# Patient Record
Sex: Male | Born: 1995
Health system: Southern US, Community
[De-identification: ages and names within clinical notes are randomized; demographics above are authoritative.]

## PROBLEM LIST (undated history)

## (undated) DIAGNOSIS — J45909 Unspecified asthma, uncomplicated: Secondary | ICD-10-CM

## (undated) DIAGNOSIS — T7840XA Allergy, unspecified, initial encounter: Secondary | ICD-10-CM

## (undated) HISTORY — PX: WISDOM TOOTH EXTRACTION: SHX21

## (undated) HISTORY — DX: Allergy, unspecified, initial encounter: T78.40XA

## (undated) HISTORY — DX: Unspecified asthma, uncomplicated: J45.909

---
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
## 2016-02-03 DIAGNOSIS — R0982 Postnasal drip: Secondary | ICD-10-CM | POA: Diagnosis not present

## 2016-02-03 DIAGNOSIS — J3489 Other specified disorders of nose and nasal sinuses: Secondary | ICD-10-CM | POA: Diagnosis not present

## 2016-02-03 DIAGNOSIS — R062 Wheezing: Secondary | ICD-10-CM | POA: Diagnosis not present

## 2016-02-03 DIAGNOSIS — R05 Cough: Secondary | ICD-10-CM | POA: Diagnosis not present

## 2016-02-03 DIAGNOSIS — J069 Acute upper respiratory infection, unspecified: Secondary | ICD-10-CM | POA: Diagnosis not present

## 2016-06-13 DIAGNOSIS — J069 Acute upper respiratory infection, unspecified: Secondary | ICD-10-CM | POA: Diagnosis not present

## 2017-03-23 ENCOUNTER — Encounter: Payer: Self-pay | Admitting: Medical

## 2017-03-23 ENCOUNTER — Ambulatory Visit: Payer: 59 | Admitting: Medical

## 2017-03-23 VITALS — BP 122/76 | HR 87 | Temp 98.3°F | Ht 69.0 in | Wt 222.0 lb

## 2017-03-23 DIAGNOSIS — J069 Acute upper respiratory infection, unspecified: Secondary | ICD-10-CM | POA: Diagnosis not present

## 2017-03-23 NOTE — Progress Notes (Signed)
Subjective:  Daniel Wood is a 22 y.o. male who presents for respiratory illness.  Here as a new patient today.  He reports a 5-day history of illness starting with cough, runny nose, froggy voice, bad headache, slight ear pressure, was worse 2 days ago, but now feels like it is improving.  He use Sudafed a few times but not taking anything regularly.  Denies fever, no nausea vomiting or diarrhea, has cough but not productive, no sore throat no body aches or chills.  No sick contacts.  Non-smoker. No other aggravating or relieving factors.  No other c/o.  No past medical history on file.  ROS as in subjective   Objective: BP 122/76   Pulse 87   Temp 98.3 F (36.8 C)   Ht 5\' 9"  (1.753 m)   Wt 222 lb (100.7 kg)   SpO2 96%   BMI 32.78 kg/m   General appearance: Alert, WD/WN, no distress, mildly ill appearing                             Skin: warm, no rash                           Head: no sinus tenderness                            Eyes: conjunctiva normal, corneas clear, PERRLA                            Ears: pearly TMs, external ear canals normal                          Nose: septum midline, turbinates swollen, with erythema and clear discharge             Mouth/throat: MMM, tongue normal, mild pharyngeal erythema                           Neck: supple, no adenopathy, no thyromegaly, non tender                          Heart: RRR, normal S1, S2, no murmurs                         Lungs: CTA bilaterally, no wheezes, rales, or rhonchi      Assessment  Encounter Diagnosis  Name Primary?  Marland Kitchen. Upper respiratory tract infection, unspecified type Yes      Plan: Discussed symptoms and exam suggesting mild viral URI.  Specific home care recommendations today include:  Only take over-the-counter (OTC) or prescription medicines for pain, discomfort, or fever as directed by your caregiver.    Decongestant: You may use OTC Guaifenesin (Mucinex plain) for congestion.  You may use  Pseudoephedrine (Sudafed) only if you don't have blood pressure problems or a diagnosis of hypertension.  Cough suppression: If you have cough from drainage, you may use over-the-counter Dextromethorphan (Delsym) as directed on the label  Sore throat remedies:  You may use salt water gargles, warm fluids such as coffee or hot tea, or honey/tea/lemon mixture to sooth sore throat pain.  You may use OTC sore throat remedies such as Cepacol lozenges or Chloraseptic  spray for sore throat pain.  Runny nose and sneezing remedies: You may use OTC antihistamine such as Zyrtec or Benadryl, but caution as these can cause drowsiness.    Pain/fever relief: You may use over-the-counter Tylenol for pain or fever  Drink extra fluids. Fluids help thin the mucus so your sinuses can drain more easily.   Applying either moist heat or ice packs to the sinus areas may help relieve discomfort.  Use saline nasal sprays to help moisten your sinuses. The sprays can be found at your local drugstore.   Patient was advised to call or return if worse or not improving in the next few days.    Patient voiced understanding of diagnosis, recommendations, and treatment plan.  After visit summary given.   He has a physical appt coming up.

## 2017-03-23 NOTE — Patient Instructions (Addendum)
Thank you for giving me the opportunity to serve you today.   Your diagnosis today includes: Encounter Diagnosis  Name Primary?  Marland Kitchen Upper respiratory tract infection, unspecified type Yes     Specific home care recommendations today include:  Only take over-the-counter (OTC) or prescription medicines for pain, discomfort, or fever as directed by your caregiver.    Decongestant: You may use OTC Guaifenesin (Mucinex plain) for congestion.  You may use Pseudoephedrine (Sudafed) only if you don't have blood pressure problems or a diagnosis of hypertension.  Cough suppression: If you have cough from drainage, you may use over-the-counter Dextromethorphan (Delsym) as directed on the label  Sore throat remedies:  You may use salt water gargles, warm fluids such as coffee or hot tea, or honey/tea/lemon mixture to sooth sore throat pain.  You may use OTC sore throat remedies such as Cepacol lozenges or Chloraseptic spray for sore throat pain.  Runny nose and sneezing remedies: You may use OTC antihistamine such as Zyrtec or Benadryl, but caution as these can cause drowsiness.    Pain/fever relief: You may use over-the-counter Tylenol for pain or fever  Drink extra fluids. Fluids help thin the mucus so your sinuses can drain more easily.   Applying either moist heat or ice packs to the sinus areas may help relieve discomfort.  Use saline nasal sprays to help moisten your sinuses. The sprays can be found at your local drugstore.    Please call or return if worse or not improving in the next few days.      I have included other useful information below for your review.   Upper Respiratory Infection, Adult An upper respiratory infection (URI) is also known as the common cold. It is often caused by a type of germ (virus). Colds are easily spread (contagious). You can pass it to others by kissing, coughing, sneezing, or drinking out of the same glass. Usually, you get better in 1 or 2 weeks.    HOME CARE   Only take medicine as told by your doctor.   Use a warm mist humidifier or breathe in steam from a hot shower.   Drink enough water and fluids to keep your pee (urine) clear or pale yellow.   Get plenty of rest.   Return to work when your temperature is back to normal or as told by your doctor. You may use a face mask and wash your hands to stop your cold from spreading.  GET HELP RIGHT AWAY IF:   After the first few days, you feel you are getting worse.   You have questions about your medicine.   You have chills, shortness of breath, or brown or red spit (mucus).   You have yellow or brown snot (nasal discharge) or pain in the face, especially when you bend forward.   You have a fever, puffy (swollen) neck, pain when you swallow, or white spots in the back of your throat.   You have a bad headache, ear pain, sinus pain, or chest pain.   You have a high-pitched whistling sound when you breathe in and out (wheezing).   You have a lasting cough or cough up blood.   You have sore muscles or a stiff neck.  MAKE SURE YOU:   Understand these instructions.   Will watch your condition.   Will get help right away if you are not doing well or get worse.  Document Released: 07/13/2007 Document Revised: 10/06/2010 Document Reviewed: 05/31/2010 [employer] Patient Information  [address] [employer], [place].

## 2017-04-04 ENCOUNTER — Encounter: Payer: Self-pay | Admitting: Medical

## 2017-04-04 ENCOUNTER — Ambulatory Visit (INDEPENDENT_AMBULATORY_CARE_PROVIDER_SITE_OTHER): Payer: 59 | Admitting: Medical

## 2017-04-04 VITALS — BP 120/78 | HR 77 | Temp 98.4°F | Ht 69.0 in | Wt 222.0 lb

## 2017-04-04 DIAGNOSIS — Z113 Encounter for screening for infections with a predominantly sexual mode of transmission: Secondary | ICD-10-CM

## 2017-04-04 DIAGNOSIS — R0600 Dyspnea, unspecified: Secondary | ICD-10-CM | POA: Insufficient documentation

## 2017-04-04 DIAGNOSIS — Z0000 Encounter for general adult medical examination without abnormal findings: Secondary | ICD-10-CM | POA: Insufficient documentation

## 2017-04-04 DIAGNOSIS — Z7185 Encounter for immunization safety counseling: Secondary | ICD-10-CM | POA: Insufficient documentation

## 2017-04-04 DIAGNOSIS — R062 Wheezing: Secondary | ICD-10-CM

## 2017-04-04 DIAGNOSIS — Z7189 Other specified counseling: Secondary | ICD-10-CM | POA: Diagnosis not present

## 2017-04-04 LAB — POCT URINALYSIS DIP (PROADVANTAGE DEVICE)
Bilirubin, UA: NEGATIVE
Glucose, UA: NEGATIVE mg/dL
Ketones, POC UA: NEGATIVE mg/dL
Leukocytes, UA: NEGATIVE
NITRITE UA: NEGATIVE
PH UA: 6 (ref 5.0–8.0)
Protein Ur, POC: NEGATIVE mg/dL
RBC UA: NEGATIVE
SPECIFIC GRAVITY, URINE: 1.025
UUROB: 3.5

## 2017-04-04 MED ORDER — MOMETASONE FURO-FORMOTEROL FUM 100-5 MCG/ACT IN AERO: 2.0000 | INHALATION_SPRAY | Freq: Two times a day (BID) | RESPIRATORY_TRACT | 0 refills | Status: DC

## 2017-04-04 NOTE — Progress Notes (Signed)
Subjective:   HPI  Daniel Wood is a 22 y.o. male who presents for a physical Chief Complaint  Patient presents with  . Annual Exam    Medical care team includes: Tysinger, Kermit Balo, PA-C here for primary care Dentist  Concerns: Recently he has noticed some wheezing and shortness of breath.  He lives at home.  Both parents smoke.  They have dogs and cats in the home.  He denies history of asthma has never had problems until the last few weeks.  He does note one instance in the last few months where he was seen by urgent care for a respiratory tract infection and was put on inhaler.  He sometimes gets short of breath after eating a large meal.  Sexually active, 1 partner ever, no concerns or symptoms  Reviewed their medical, surgical, family, social, medication, and allergy history and updated chart as appropriate.  No past medical history on file.  Past Surgical History:  Procedure Laterality Date  . NO PAST SURGERIES  03/2017    Social History   Socioeconomic History  . Marital status: Single    Spouse name: Not on file  . Number of children: Not on file  . Years of education: Not on file  . Highest education level: Not on file  Social Needs  . Financial resource strain: Not on file  . Food insecurity - worry: Not on file  . Food insecurity - inability: Not on file  . Transportation needs - medical: Not on file  . Transportation needs - non-medical: Not on file  Occupational History  . Not on file  Tobacco Use  . Smoking status: Never Smoker  . Smokeless tobacco: Never Used  Substance and Sexual Activity  . Alcohol use: Yes    Comment: occ  . Drug use: No  . Sexual activity: Not on file  Other Topics Concern  . Not on file  Social History Narrative   Supervisor at [employer], exercise - some.   Diet - somewhat healthy.   Girlfriend.  No children.  03/2017    Family History  Problem Relation Age of Onset  . Asthma Sister   . Diabetes Maternal Grandmother   .  Hypertension Maternal Grandmother   . Heart disease Neg Hx   . Stroke Neg Hx   . Cancer Neg Hx      Current Outpatient Medications:  .  cetirizine (ZYRTEC) 10 MG tablet, TAKE 1 TABLET BY MOUTH EVERY DAY (Patient not taking: Reported on 03/23/2017), Disp: 30 tablet, Rfl: 11 .  mometasone-formoterol (DULERA) 100-5 MCG/ACT AERO, Inhale 2 puffs into the lungs 2 (two) times daily., Disp: 8.8 g, Rfl: 0 .  pseudoephedrine (SUDAFED) 30 MG tablet, Take 30 mg by mouth every 4 (four) hours as needed for congestion., Disp: , Rfl:   Allergies  Allergen Reactions  . Shellfish-Derived Products     Review of Systems Constitutional: -fever, -chills, -sweats, -unexpected weight change, -decreased appetite, -fatigue Allergy: -sneezing, -itching, -congestion Dermatology: -changing moles, --rash, -lumps ENT: -runny nose, -ear pain, -sore throat, -hoarseness, -sinus pain, -teeth pain, - ringing in ears, -hearing loss, -nosebleeds Cardiology: -chest pain, -palpitations, -swelling, -difficulty breathing when lying flat, -waking up short of breath Respiratory: -cough, +shortness of breath, -difficulty breathing with exercise or exertion, -wheezing, -coughing up blood Gastroenterology: -abdominal pain, -nausea, -vomiting, -diarrhea, -constipation, -blood in stool, -changes in bowel movement, -difficulty swallowing or eating Hematology: -bleeding, -bruising  Musculoskeletal: -joint aches, -muscle aches, -joint swelling, -back pain, -neck pain, -cramping, -  changes in gait Ophthalmology: denies vision changes, eye redness, itching, discharge Urology: -burning with urination, -difficulty urinating, -blood in urine, -urinary frequency, -urgency, -incontinence Neurology: -headache, -weakness, -tingling, -numbness, -memory loss, -falls, -dizziness Psychology: -depressed mood, -agitation, -sleep problems     Objective:   BP 120/78 (BP Location: Left Arm, Patient Position: Sitting)   Pulse 77   Temp 98.4 F (36.9  C)   Ht 5\' 9"  (1.753 m)   Wt 222 lb (100.7 kg)   SpO2 98%   BMI 32.78 kg/m   General appearance: alert, no distress, WD/WN, African American male Skin: tattoo of wolf and tress left forearm, otherwise no worrisome lesions HEENT: normocephalic, conjunctiva/corneas normal, sclerae anicteric, PERRLA, EOMi, nares patent, no discharge or erythema, pharynx normal Oral cavity: MMM, tongue normal, teeth normal Neck: supple, no lymphadenopathy, no thyromegaly, no masses, normal ROM, no bruits Chest: non tender, normal shape and expansion Heart: RRR, normal S1, S2, no murmurs Lungs: CTA bilaterally, no wheezes, rhonchi, or rales Abdomen: +bs, soft, non tender, non distended, no masses, no hepatomegaly, no splenomegaly, no bruits Back: non tender, normal ROM, no scoliosis Musculoskeletal: upper extremities non tender, no obvious deformity, normal ROM throughout, lower extremities non tender, no obvious deformity, normal ROM throughout Extremities: no edema, no cyanosis, no clubbing Pulses: 2+ symmetric, upper and lower extremities, normal cap refill Neurological: alert, oriented x 3, CN2-12 intact, strength normal upper extremities and lower extremities, sensation normal throughout, DTRs 2+ throughout, no cerebellar signs, gait normal Psychiatric: normal affect, behavior normal, pleasant  GU: normal male external genitalia,circumcised, nontender, no masses, no hernia, no lymphadenopathy Rectal: deferred   Assessment and Plan :    Encounter Diagnoses  Name Primary?  . Encounter for health maintenance examination in adult Yes  . Screen for STD (sexually transmitted disease)   . Dyspnea, unspecified type   . Vaccine counseling     Physical exam - discussed and counseled on healthy lifestyle, diet, exercise, preventative care, vaccinations, sick and well care, proper use of emergency dept and after hours care, and addressed their concerns.    Health screening: See your eye doctor yearly for  routine vision care. See your dentist yearly for routine dental care including hygiene visits twice yearly.  Discussed STD testing, discussed prevention, condom use, means of transmission  Cancer screening Discussed self testicular exams  Vaccinations: Counseled on the following vaccines:  Tdap, he will consider, but declined today   Separate significant chronic issues discussed: Dyspnea -PFT abnormal today suggestive of asthma.  He does have potential allergy triggers with cat and dog dander as well as tobacco smoke.  Advise she avoid large portions, discussed possible diagnosis of asthma with mild symptoms.  Discussed rescue versus preventative medicines.  Begin trial of Dulera for 2 weeks and then call or recheck  Tennyson was seen today for annual exam.  Diagnoses and all orders for this visit:  Encounter for health maintenance examination in adult -     Comprehensive metabolic panel -     CBC -     Lipid panel -     HIV antibody -     RPR -     GC/Chlamydia Probe Amp -     TSH -     POCT Urinalysis DIP (Proadvantage Device)  Screen for STD (sexually transmitted disease) -     HIV antibody -     RPR -     GC/Chlamydia Probe Amp  Dyspnea, unspecified type -     Spirometry with  graph  Vaccine counseling  Other orders -     mometasone-formoterol (DULERA) 100-5 MCG/ACT AERO; Inhale 2 puffs into the lungs 2 (two) times daily.   Follow-up pending labs, yearly for physical

## 2017-04-05 LAB — TSH: TSH: 1.9 u[IU]/mL (ref 0.450–4.500)

## 2017-04-05 LAB — COMPREHENSIVE METABOLIC PANEL
ALBUMIN: 5.1 g/dL (ref 3.5–5.5)
ALT: 33 IU/L (ref 0–44)
AST: 21 IU/L (ref 0–40)
Albumin/Globulin Ratio: 1.5 (ref 1.2–2.2)
Alkaline Phosphatase: 112 IU/L (ref 39–117)
BUN / CREAT RATIO: 12 (ref 9–20)
BUN: 10 mg/dL (ref 6–20)
Bilirubin Total: 0.3 mg/dL (ref 0.0–1.2)
CO2: 22 mmol/L (ref 20–29)
CREATININE: 0.86 mg/dL (ref 0.76–1.27)
Calcium: 10.2 mg/dL (ref 8.7–10.2)
Chloride: 101 mmol/L (ref 96–106)
GFR calc Af Amer: 144 mL/min/{1.73_m2} (ref >59)
GFR calc non Af Amer: 125 mL/min/{1.73_m2} (ref >59)
GLUCOSE: 97 mg/dL (ref 65–99)
Globulin, Total: 3.3 g/dL (ref 1.5–4.5)
Potassium: 4 mmol/L (ref 3.5–5.2)
Sodium: 141 mmol/L (ref 134–144)
TOTAL PROTEIN: 8.4 g/dL (ref 6.0–8.5)

## 2017-04-05 LAB — CBC
HEMATOCRIT: 43.4 % (ref 37.5–51.0)
HEMOGLOBIN: 14.9 g/dL (ref 13.0–17.7)
MCH: 29 pg (ref 26.6–33.0)
MCHC: 34.3 g/dL (ref 31.5–35.7)
MCV: 85 fL (ref 79–97)
Platelets: 250 10*3/uL (ref 150–379)
RBC: 5.13 x10E6/uL (ref 4.14–5.80)
RDW: 13.9 % (ref 12.3–15.4)
WBC: 12.4 10*3/uL — AB (ref 3.4–10.8)

## 2017-04-05 LAB — LIPID PANEL
CHOL/HDL RATIO: 3.6 ratio (ref 0.0–5.0)
Cholesterol, Total: 166 mg/dL (ref 100–199)
HDL: 46 mg/dL (ref >39)
LDL CALC: 101 mg/dL — AB (ref 0–99)
Triglycerides: 93 mg/dL (ref 0–149)
VLDL CHOLESTEROL CAL: 19 mg/dL (ref 5–40)

## 2017-04-05 LAB — GC/CHLAMYDIA PROBE AMP
Chlamydia trachomatis, NAA: NEGATIVE
NEISSERIA GONORRHOEAE BY PCR: NEGATIVE

## 2017-04-05 LAB — HIV ANTIBODY (ROUTINE TESTING W REFLEX): HIV SCREEN 4TH GENERATION: NONREACTIVE

## 2017-04-05 LAB — RPR: RPR Ser Ql: NONREACTIVE

## 2017-06-22 ENCOUNTER — Encounter: Payer: Self-pay | Admitting: Medical

## 2017-06-22 ENCOUNTER — Ambulatory Visit: Payer: 59 | Admitting: Medical

## 2017-06-22 VITALS — BP 120/88 | HR 74 | Temp 98.1°F | Ht 69.25 in | Wt 226.0 lb

## 2017-06-22 DIAGNOSIS — J4540 Moderate persistent asthma, uncomplicated: Secondary | ICD-10-CM

## 2017-06-22 DIAGNOSIS — R0602 Shortness of breath: Secondary | ICD-10-CM | POA: Diagnosis not present

## 2017-06-22 DIAGNOSIS — R062 Wheezing: Secondary | ICD-10-CM | POA: Diagnosis not present

## 2017-06-22 MED ORDER — BECLOMETHASONE DIPROP HFA 80 MCG/ACT IN AERB: 2.0000 | INHALATION_SPRAY | Freq: Two times a day (BID) | RESPIRATORY_TRACT | 5 refills | Status: DC

## 2017-06-22 MED ORDER — ALBUTEROL SULFATE HFA 108 (90 BASE) MCG/ACT IN AERS: 2.0000 | INHALATION_SPRAY | Freq: Four times a day (QID) | RESPIRATORY_TRACT | 2 refills | Status: DC | PRN

## 2017-06-22 NOTE — Progress Notes (Signed)
Subjective: Chief Complaint  Patient presents with  . Wheezing    chest congestion,still feels sob sometimes    Here for chest congestion, f/u from last visit.  Here for wheezing, chest congestion, SOB.   Was doing better last time I saw him back in February, but things flared back up.  He had used the 14 day Dulera sample.  Did well on this, improved.  But after sample didn't call us back as he felt fine.   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 Has cat and dog and is around chemicals and dust at work.  Nonsmoker.   No chest pain, no edema, no palpitations, no fever, no productive cough.   No other aggravating or relieving factors. No other complaint.   No past medical history on file.   Current Outpatient Medications on File Prior to Visit  Medication Sig Dispense Refill  . cetirizine (ZYRTEC) 10 MG tablet TAKE 1 TABLET BY MOUTH EVERY DAY (Patient not taking: Reported on 03/23/2017) 30 tablet 11  . pseudoephedrine (SUDAFED) 30 MG tablet Take 30 mg by mouth every 4 (four) hours as needed for congestion.     No current facility-administered medications on file prior to visit.    ROS as in subjective     Objective: BP 120/88   Pulse 74   Temp 98.1 F (36.7 C) (Oral)   Ht 5' 9.25" (1.759 m)   Wt 226 lb (102.5 kg)   SpO2 96%   BMI 33.13 kg/m   Wt Readings from Last 3 Encounters:  06/22/17 226 lb (102.5 kg)  04/04/17 222 lb (100.7 kg)  03/23/17 222 lb (100.7 kg)    General appearance: alert, no distress, WD/WN,  HEENT: normocephalic, sclerae anicteric, mild injection of conjunctiva bilat, TMs pearly, nares patent, no discharge or erythema, pharynx normal Oral cavity: MMM, no lesions Neck: supple, no lymphadenopathy, no thyromegaly, no masses Heart: RRR, normal S1, S2, no murmurs Lungs: faint wheeze, slightly decreased breath sounds, no wheezes, rhonchi, or rales Ext: no edema Pulses: 2+ symmetric, upper and lower  extremities, normal cap refill     Assessment: Encounter Diagnoses  Name Primary?  . Moderate persistent extrinsic asthma without complication Yes  . Wheezing   . SOB (shortness of breath)      Plan:  Reviewed PFT from 03/2017.   Symptoms and exam still suggests allergic asthma.   Begin zyrtec, begin Qvar inhaler for prevention, begin Albuterol for acute symptoms, avoid triggers when possible, wear mask when mowing.   If not improved within a week then call back.   Plan to use this regimen through the summer and we can reassess as fall approaches.   Can go for xray to further evaluate.    Nikolai was seen today for wheezing.  Diagnoses and all orders for this visit:  Moderate persistent extrinsic asthma without complication -     DG Chest 2 View; Future  Wheezing  SOB (shortness of breath)  Other orders -     beclomethasone (QVAR REDIHALER) 80 MCG/ACT inhaler; Inhale 2 puffs into the lungs 2 (two) times daily. -     albuterol (PROAIR HFA) 108 (90 Base) MCG/ACT inhaler; Inhale 2 puffs into the lungs every 6 (six) hours as needed for wheezing or shortness of breath.

## 2017-08-04 IMAGING — CR DG ELBOW COMPLETE 3+V*L*
4 series · 4 of 4 positions shown · non-contrast
Comparison: None.

CLINICAL DATA: Left elbow pain. Jumped off a roof of approximately
9 foot height today. Possible dislocation. Initial encounter.

EXAM:
LEFT ELBOW - COMPLETE 3+ VIEW

[elbow ap]
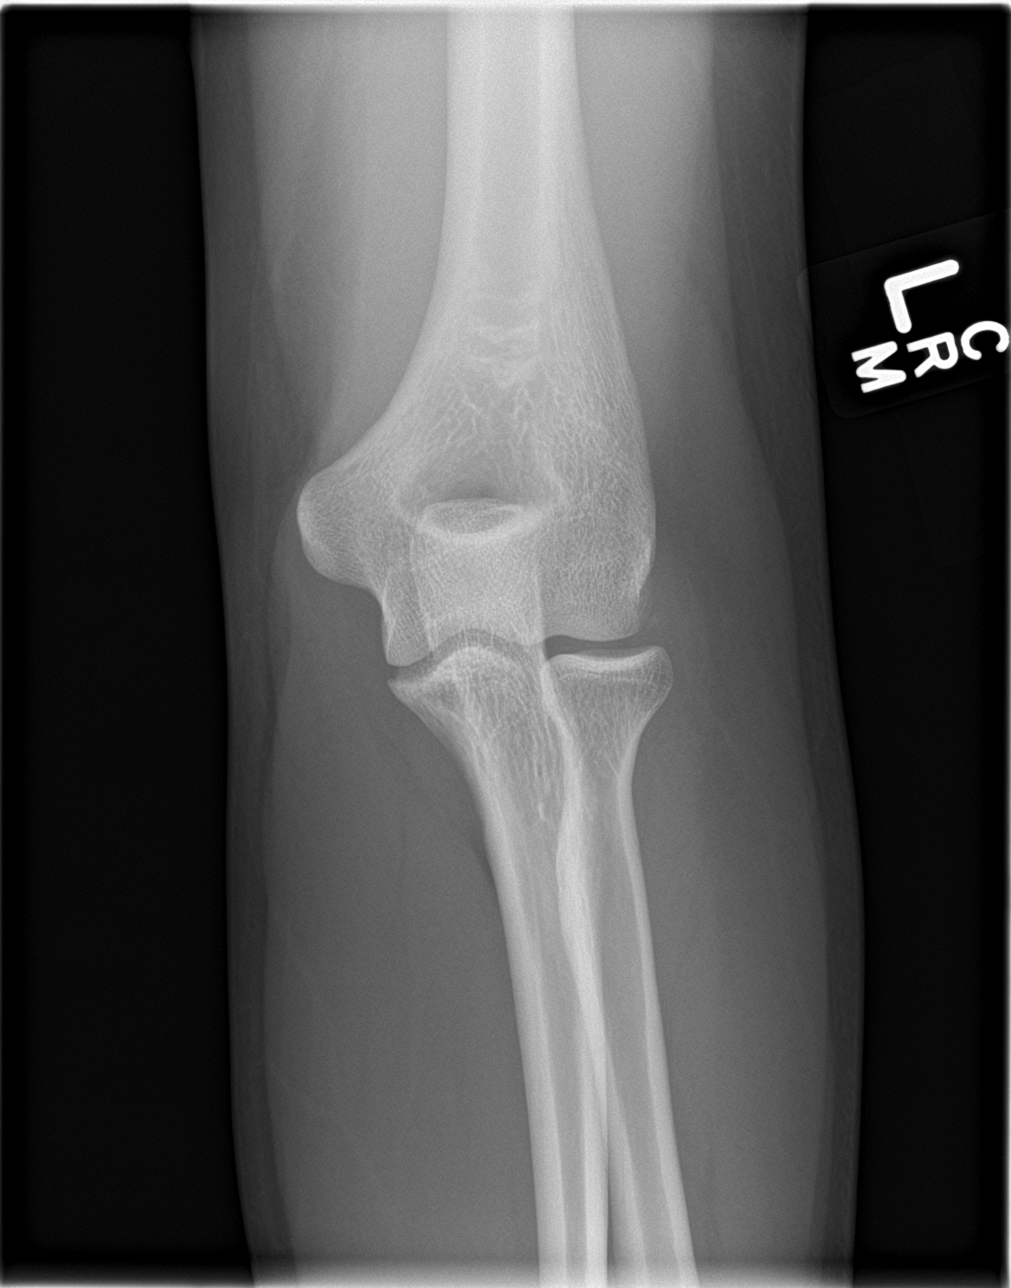

[elbow obl (1 of 2)]
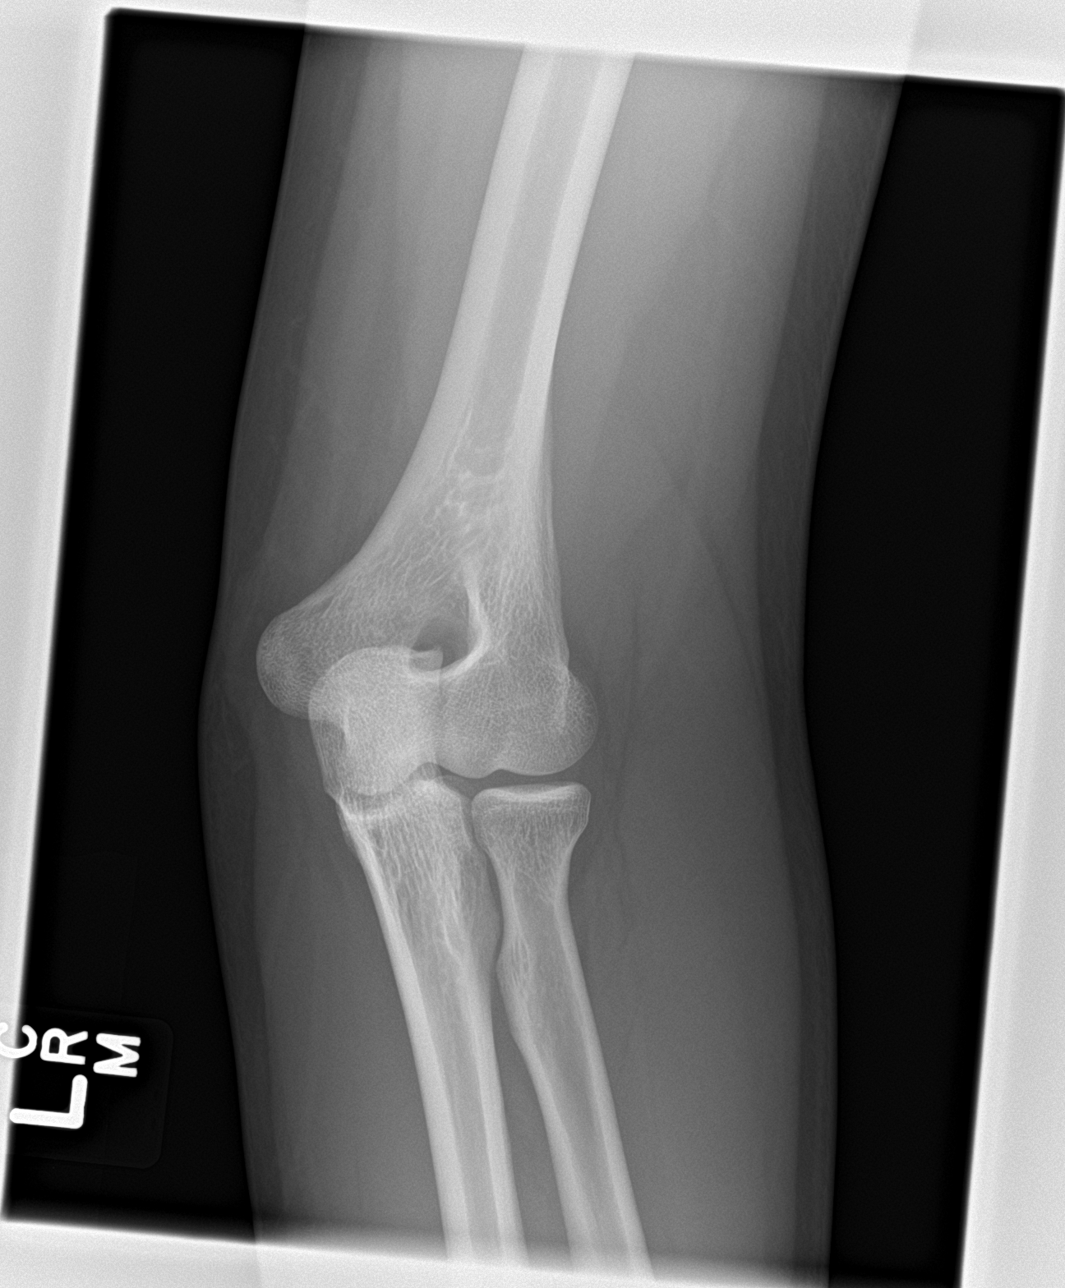

[elbow obl (2 of 2)]
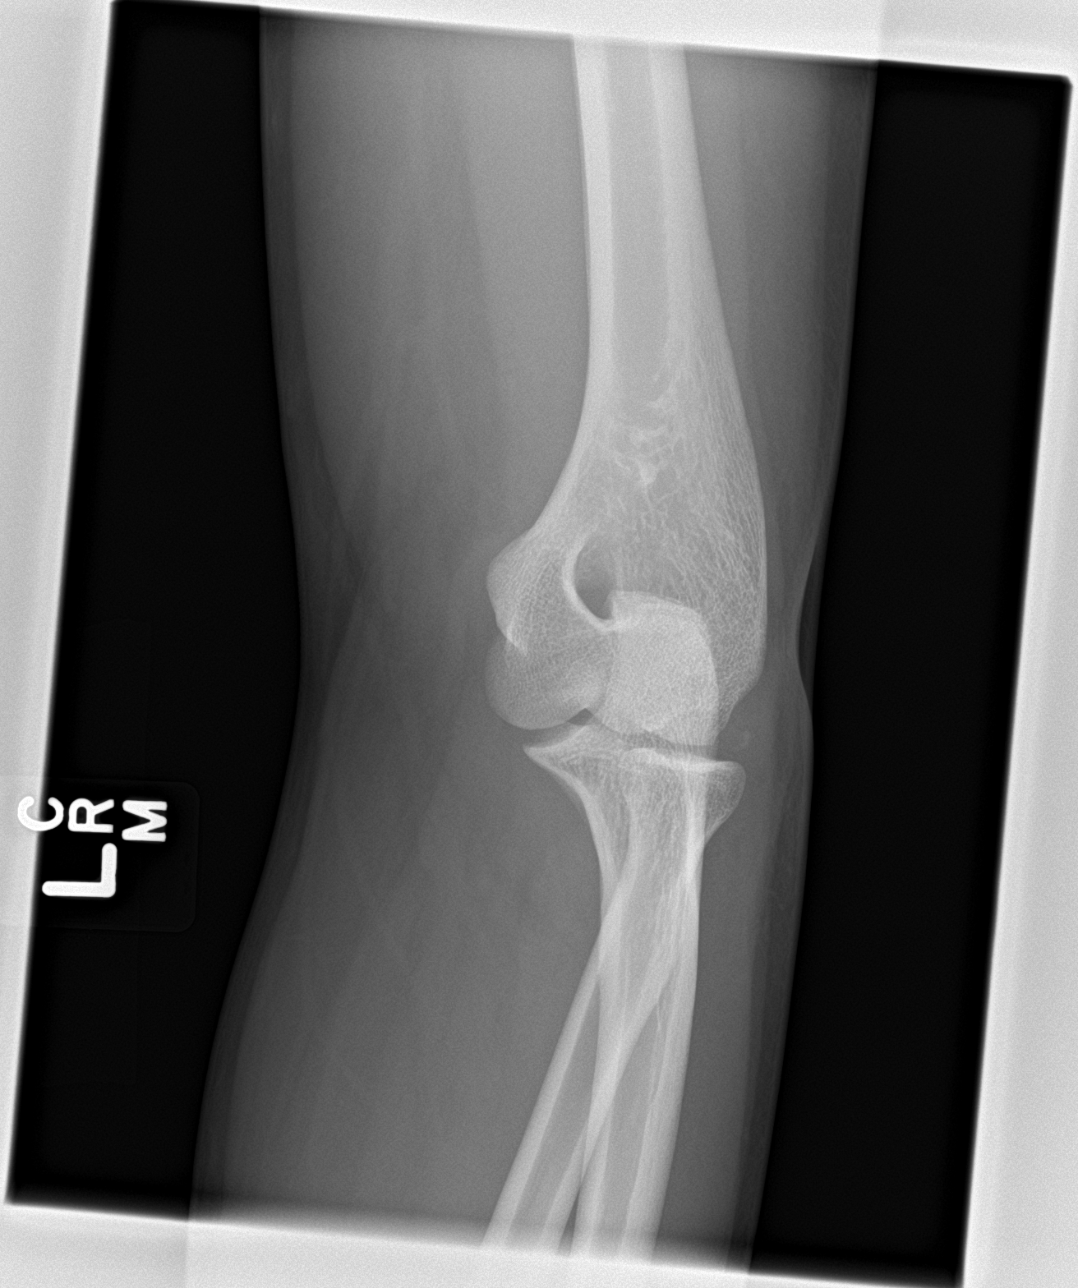

[elbow lat]
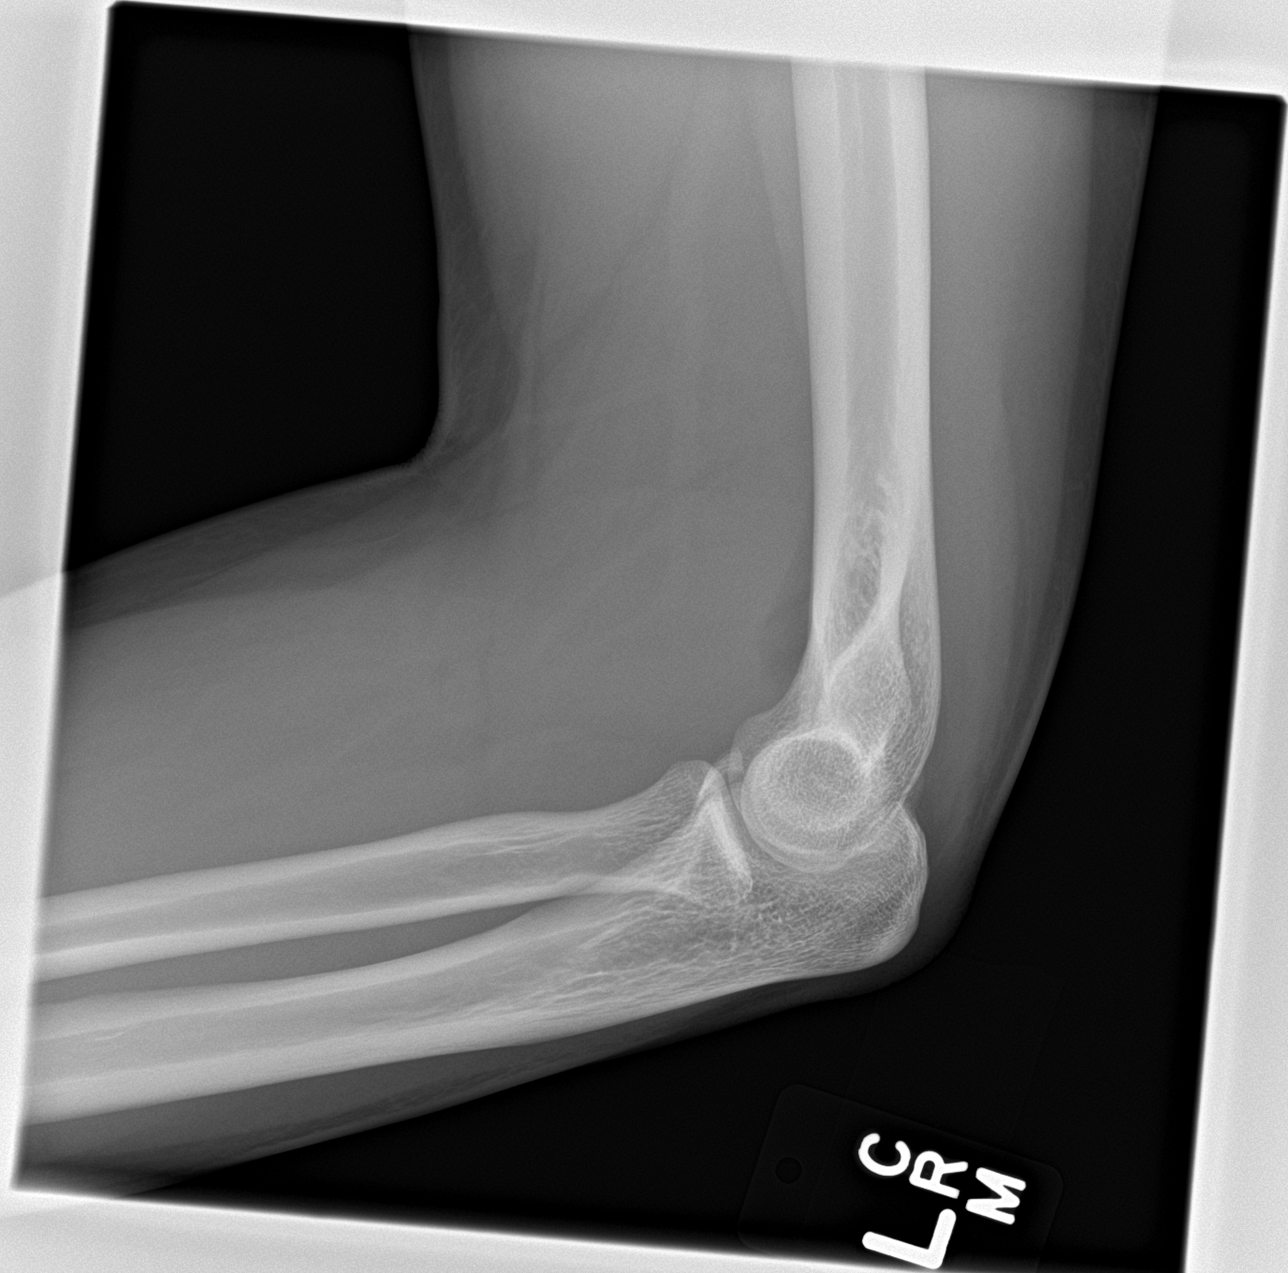

[4 of 4 positions shown; findings below may reference images not displayed]

FINDINGS: There is a moderate-sized elbow joint effusion. There is no
dislocation. A fracture of the coronoid process of the ulna is
present without evidence of significant displacement. The proximal
radius appears intact. No focal osseous lesion is seen.
IMPRESSION: Coronoid process fracture with elbow joint effusion.

## 2017-11-20 DIAGNOSIS — A084 Viral intestinal infection, unspecified: Secondary | ICD-10-CM | POA: Diagnosis not present

## 2017-11-27 DIAGNOSIS — J45901 Unspecified asthma with (acute) exacerbation: Secondary | ICD-10-CM | POA: Diagnosis not present

## 2017-11-27 DIAGNOSIS — R0602 Shortness of breath: Secondary | ICD-10-CM | POA: Diagnosis not present

## 2017-12-25 MED FILL — PROAIR HFA 90 MCG INHALER: 108 (90 BAS | 17 days supply | Qty: 9 | Fill #0

## 2018-04-05 ENCOUNTER — Encounter: Payer: 59 | Admitting: Medical

## 2018-04-20 ENCOUNTER — Encounter: Payer: Self-pay | Admitting: Medical

## 2018-05-04 ENCOUNTER — Other Ambulatory Visit: Payer: Self-pay

## 2018-05-04 DIAGNOSIS — J4540 Moderate persistent asthma, uncomplicated: Secondary | ICD-10-CM

## 2018-05-04 MED ORDER — ALBUTEROL SULFATE HFA 108 (90 BASE) MCG/ACT IN AERS: 2.0000 | INHALATION_SPRAY | Freq: Four times a day (QID) | RESPIRATORY_TRACT | 2 refills | Status: DC | PRN

## 2018-05-07 ENCOUNTER — Other Ambulatory Visit: Payer: Self-pay

## 2018-05-07 DIAGNOSIS — J4540 Moderate persistent asthma, uncomplicated: Secondary | ICD-10-CM

## 2018-05-07 MED ORDER — ALBUTEROL SULFATE HFA 108 (90 BASE) MCG/ACT IN AERS: 2.0000 | INHALATION_SPRAY | Freq: Four times a day (QID) | RESPIRATORY_TRACT | 2 refills | Status: DC | PRN

## 2018-05-10 ENCOUNTER — Encounter: Payer: Self-pay | Admitting: Medical

## 2018-05-16 ENCOUNTER — Other Ambulatory Visit: Payer: Self-pay | Admitting: Medical

## 2018-05-16 ENCOUNTER — Telehealth: Payer: Self-pay | Admitting: Medical

## 2018-05-16 DIAGNOSIS — J4540 Moderate persistent asthma, uncomplicated: Secondary | ICD-10-CM

## 2018-05-16 MED ORDER — ALBUTEROL SULFATE HFA 108 (90 BASE) MCG/ACT IN AERS: 2.0000 | INHALATION_SPRAY | Freq: Four times a day (QID) | RESPIRATORY_TRACT | 0 refills | Status: DC | PRN

## 2018-05-16 NOTE — Telephone Encounter (Signed)
Pt called and is requesting that is proair hfa is sent to the [facility] [address] [place], [place], [place] [address]  it was sent to the wrong pharmacy pt can be reached at [phone]

## 2018-05-16 NOTE — Telephone Encounter (Signed)
Last visit > 1 year ago, its allergy season currently which flares up asthma which is part of the reason for the virtual visit to evaluate whether he needs other recommendations to keep asthma from being a problem this time of year.   We are doing this to keep him out of the office to avoid exposures.    Thus, needs virtual visit before refill.

## 2018-05-16 NOTE — Telephone Encounter (Signed)
Pt was not happy and phone was disconnected. KH

## 2018-05-16 NOTE — Telephone Encounter (Signed)
This was sent in back on 3/30 but pt has not been seen since may 2019. Pt was advised he needed a virtual visit since its been a long time since being seen. Pt states he does not want to do this and only wants his med refilled since it was sent to wrong pharmacy.

## 2018-06-08 HISTORY — PX: NO PAST SURGERIES: SHX2092

## 2018-06-14 ENCOUNTER — Encounter: Payer: Self-pay | Admitting: Medical

## 2018-06-14 ENCOUNTER — Ambulatory Visit (INDEPENDENT_AMBULATORY_CARE_PROVIDER_SITE_OTHER): Payer: No Typology Code available for payment source | Admitting: Medical

## 2018-06-14 ENCOUNTER — Other Ambulatory Visit: Payer: Self-pay

## 2018-06-14 VITALS — BP 110/70 | HR 78 | Temp 98.6°F | Resp 16 | Ht 71.0 in | Wt 236.6 lb

## 2018-06-14 DIAGNOSIS — Z0000 Encounter for general adult medical examination without abnormal findings: Secondary | ICD-10-CM

## 2018-06-14 DIAGNOSIS — Z23 Encounter for immunization: Secondary | ICD-10-CM | POA: Diagnosis not present

## 2018-06-14 DIAGNOSIS — J301 Allergic rhinitis due to pollen: Secondary | ICD-10-CM | POA: Diagnosis not present

## 2018-06-14 DIAGNOSIS — J45909 Unspecified asthma, uncomplicated: Secondary | ICD-10-CM | POA: Insufficient documentation

## 2018-06-14 DIAGNOSIS — J4540 Moderate persistent asthma, uncomplicated: Secondary | ICD-10-CM | POA: Diagnosis not present

## 2018-06-14 LAB — CBC
Hematocrit: 43.7 % (ref 37.5–51.0)
Hemoglobin: 15.3 g/dL (ref 13.0–17.7)
MCH: 29.1 pg (ref 26.6–33.0)
MCHC: 35 g/dL (ref 31.5–35.7)
MCV: 83 fL (ref 79–97)
Platelets: 242 10*3/uL (ref 150–450)
RBC: 5.25 x10E6/uL (ref 4.14–5.80)
RDW: 13 % (ref 11.6–15.4)
WBC: 11.8 10*3/uL — ABNORMAL HIGH (ref 3.4–10.8)

## 2018-06-14 LAB — POCT URINALYSIS DIP (PROADVANTAGE DEVICE)
Bilirubin, UA: NEGATIVE
Blood, UA: NEGATIVE
Glucose, UA: NEGATIVE mg/dL
Ketones, POC UA: NEGATIVE mg/dL
Leukocytes, UA: NEGATIVE
Nitrite, UA: NEGATIVE
Protein Ur, POC: NEGATIVE mg/dL
Specific Gravity, Urine: 1.025
Urobilinogen, Ur: NEGATIVE
pH, UA: 6 (ref 5.0–8.0)

## 2018-06-14 MED ORDER — BECLOMETHASONE DIPROP HFA 80 MCG/ACT IN AERB: 2.0000 | INHALATION_SPRAY | Freq: Two times a day (BID) | RESPIRATORY_TRACT | 5 refills | Status: DC

## 2018-06-14 MED ORDER — CETIRIZINE HCL 10 MG PO TABS: 10.0000 mg | ORAL_TABLET | Freq: Every day | ORAL | 11 refills | Status: DC

## 2018-06-14 MED ORDER — ALBUTEROL SULFATE HFA 108 (90 BASE) MCG/ACT IN AERS: 2.0000 | INHALATION_SPRAY | Freq: Four times a day (QID) | RESPIRATORY_TRACT | 1 refills | Status: DC | PRN

## 2018-06-14 NOTE — Progress Notes (Signed)
done

## 2018-06-14 NOTE — Patient Instructions (Addendum)
Asthma and allergies  You seem to have a problem with asthma and allergies in the spring and fall but not so much the rest of the year  Here are the treatment recommendations  I recommend you use these treatment recommendations in early March lasting through April or even possibly may  Early May I would begin back on Qvar 1 puff twice daily.  Qvar is an inhaled steroid which is meant for maintenance of prevention of asthma flareups.  Qvar is not an emergency inhaler like albuterol.  Rinse your mouth out with water and swallow after using Qvar  In early May also begin back on Zyrtec once daily at bedtime allergy pill  You can use the albuterol rescue inhaler 2 puffs every 4-6 hours as needed for coughing spells, wheezing, shortness of breath or chest tightness  Repeat the same treatment recommendation in early September last seen third October for follow allergy season  If at any time your asthma symptoms or not controlled with the regimen above or if you are having trouble breathing in general please call or return   See your dentist regularly  Check your testicles monthly for lumps or bumps.  This is a testicular cancer screening   Thanks for trusting [place] with your health care and for coming in for a physical today.  Below are some general recommendations I have for you:  Yearly screenings See your eye doctor yearly for routine vision care. See your dentist yearly for routine dental care including hygiene visits twice yearly. See me here yearly for a routine physical and preventative care visit   Specific Concerns today:  .    Please follow up yearly for a physical.   Preventative Care for Adults - Male      MAINTAIN REGULAR HEALTH EXAMS:  A routine yearly physical is a good way to check in with your primary care provider about your health and preventive screening. It is also an opportunity to share updates about your health and any concerns you have, and receive a  thorough all-over exam.   Most health insurance companies pay for at least some preventative services.  Check with your health plan for specific coverages.  WHAT PREVENTATIVE SERVICES DO MEN NEED?  Adult men should have their weight and blood pressure checked regularly.   Men age 51 and older should have their cholesterol levels checked regularly.  Beginning at age 84 and continuing to age 80, men should be screened for colorectal cancer.  Certain people may need continued testing until age 51.  Updating vaccinations is part of preventative care.  Vaccinations help protect against diseases such as the flu.  Osteoporosis is a disease in which the bones lose minerals and strength as we age. Men ages 45 and over should discuss this with their caregivers  Lab tests are generally done as part of preventative care to screen for anemia and blood disorders, to screen for problems with the kidneys and liver, to screen for bladder problems, to check blood sugar, and to check your cholesterol level.  Preventative services generally include counseling about diet, exercise, avoiding tobacco, drugs, excessive alcohol consumption, and sexually transmitted infections.    GENERAL RECOMMENDATIONS FOR GOOD HEALTH:  Healthy diet:  Eat a variety of foods, including fruit, vegetables, animal or vegetable protein, such as meat, fish, chicken, and eggs, or beans, lentils, tofu, and grains, such as rice.  Drink plenty of water daily.  Decrease saturated fat in the diet, avoid lots of red meat,  processed foods, sweets, fast foods, and fried foods.  Exercise:  Aerobic exercise helps maintain good heart health. At least 30-40 minutes of moderate-intensity exercise is recommended. For example, a brisk walk that increases your heart rate and breathing. This should be done on most days of the week.   Find a type of exercise or a variety of exercises that you enjoy so that it becomes a part of your daily life.   Examples are running, walking, swimming, water aerobics, and biking.  For motivation and support, explore group exercise such as aerobic class, spin class, Zumba, Yoga,or  martial arts, etc.    Set exercise goals for yourself, such as a certain weight goal, walk or run in a race such as a 5k walk/run.  Speak to your primary care provider about exercise goals.  Disease prevention:  If you smoke or chew tobacco, find out from your caregiver how to quit. It can literally save your life, no matter how long you have been a tobacco user. If you do not use tobacco, never begin.   Maintain a healthy diet and normal weight. Increased weight leads to problems with blood pressure and diabetes.   The Body Mass Index or BMI is a way of measuring how much of your body is fat. Having a BMI above 27 increases the risk of heart disease, diabetes, hypertension, stroke and other problems related to obesity. Your caregiver can help determine your BMI and based on it develop an exercise and dietary program to help you achieve or maintain this important measurement at a healthful level.  High blood pressure causes heart and blood vessel problems.  Persistent high blood pressure should be treated with medicine if weight loss and exercise do not work.   Fat and cholesterol leaves deposits in your arteries that can block them. This causes heart disease and vessel disease elsewhere in your body.  If your cholesterol is found to be high, or if you have heart disease or certain other medical conditions, then you may need to have your cholesterol monitored frequently and be treated with medication.   Ask if you should have a cardiac stress test if your history suggests this. A stress test is a test done on a treadmill that looks for heart disease. This test can find disease prior to there being a problem.  Osteoporosis is a disease in which the bones lose minerals and strength as we age. This can result in serious bone  fractures. Risk of osteoporosis can be identified using a bone density scan. Men ages 87 and over should discuss this with their caregivers. Ask your caregiver whether you should be taking a calcium supplement and Vitamin D, to reduce the rate of osteoporosis.   Avoid drinking alcohol in excess (more than two drinks per day).  Avoid use of street drugs. Do not share needles with anyone. Ask for professional help if you need assistance or instructions on stopping the use of alcohol, cigarettes, and/or drugs.  Brush your teeth twice a day with fluoride toothpaste, and floss once a day. Good oral hygiene prevents tooth decay and gum disease. The problems can be painful, unattractive, and can cause other health problems. Visit your dentist for a routine oral and dental check up and preventive care every 6-12 months.   Look at your skin regularly.  Use a mirror to look at your back. Notify your caregivers of changes in moles, especially if there are changes in shapes, colors, a size larger than a  pencil eraser, an irregular border, or development of new moles.  Safety:  Use seatbelts 100% of the time, whether driving or as a passenger.  Use safety devices such as hearing protection if you work in environments with loud noise or significant background noise.  Use safety glasses when doing any work that could send debris in to the eyes.  Use a helmet if you ride a bike or motorcycle.  Use appropriate safety gear for contact sports.  Talk to your caregiver about gun safety.  Use sunscreen with a SPF (or skin protection factor) of 15 or greater.  Lighter skinned people are at a greater risk of skin cancer. Don't forget to also wear sunglasses in order to protect your eyes from too much damaging sunlight. Damaging sunlight can accelerate cataract formation.   Practice safe sex. Use condoms. Condoms are used for birth control and to help reduce the spread of sexually transmitted infections (or STIs).  Some of the  STIs are gonorrhea (the clap), chlamydia, syphilis, trichomonas, herpes, HPV (human papilloma virus) and HIV (human immunodeficiency virus) which causes AIDS. The herpes, HIV and HPV are viral illnesses that have no cure. These can result in disability, cancer and death.   Keep carbon monoxide and smoke detectors in your home functioning at all times. Change the batteries every 6 months or use a model that plugs into the wall.   Vaccinations:  Stay up to date with your tetanus shots and other required immunizations. You should have a booster for tetanus every 10 years. Be sure to get your flu shot every year, since 5%-20% of the [place] population comes down with the flu. The flu vaccine changes each year, so being vaccinated once is not enough. Get your shot in the fall, before the flu season peaks.   Other vaccines to consider:  Human Papilloma Virus or HPV causes cancer of the cervix, and other infections that can be transmitted from person to person. There is a vaccine for HPV, and males should get immunized between the ages of 7111 and 6226. It requires a series of 3 shots.   Pneumococcal vaccine to protect against certain types of pneumonia.  This is normally recommended for adults age 22 or older.  However, adults younger than 22 years old with certain underlying conditions such as diabetes, heart or lung disease should also receive the vaccine.  Shingles vaccine to protect against Varicella Zoster if you are older than age 860, or younger than 22 years old with certain underlying illness.  If you have not had the Shingrix vaccine, please call your insurer to inquire about coverage for the Shingrix vaccine given in 2 doses.   Some insurers cover this vaccine after age 22, some cover this after age 22.  If your insurer covers this, then call to schedule appointment to have this vaccine here  Hepatitis A vaccine to protect against a form of infection of the liver by a virus acquired from  food.  Hepatitis B vaccine to protect against a form of infection of the liver by a virus acquired from blood or body fluids, particularly if you work in health care.  If you plan to travel internationally, check with your local health department for specific vaccination recommendations.   What should I know about cancer screening? Many types of cancers can be detected early and may often be prevented. Lung Cancer  You should be screened every year for lung cancer if: ? You are a current smoker who has  smoked for at least 30 years. ? You are a former smoker who has quit within the past 15 years.  Talk to your health care provider about your screening options, when you should start screening, and how often you should be screened.  Colorectal Cancer  Routine colorectal cancer screening usually begins at 22 years of age and should be repeated every 5-10 years until you are 22 years old. You may need to be screened more often if early forms of precancerous polyps or small growths are found. Your health care provider may recommend screening at an earlier age if you have risk factors for colon cancer.  Your health care provider may recommend using home test kits to check for hidden blood in the stool.  A small camera at the end of a tube can be used to examine your colon (sigmoidoscopy or colonoscopy). This checks for the earliest forms of colorectal cancer.  Prostate and Testicular Cancer  Depending on your age and overall health, your health care provider may do certain tests to screen for prostate and testicular cancer.  Talk to your health care provider about any symptoms or concerns you have about testicular or prostate cancer.  Skin Cancer  Check your skin from head to toe regularly.  Tell your health care provider about any new moles or changes in moles, especially if: ? There is a change in a mole's size, shape, or color. ? You have a mole that is larger than a pencil  eraser.  Always use sunscreen. Apply sunscreen liberally and repeat throughout the day.  Protect yourself by wearing long sleeves, pants, a wide-brimmed hat, and sunglasses when outside.

## 2018-06-14 NOTE — Progress Notes (Signed)
Subjective:   HPI  Daniel Wood is a 23 y.o. male who presents for Chief Complaint  Patient presents with  . CPE    fasting CPE     Patient Care Team: Tysinger, Kermit Baloavid S, PA-C as PCP - General (Family Medicine) Sees dentist Sees eye doctor  Concerns: Been in usual state of health.  He does note in the spring and the fall he does have some problems with breathing and allergy symptoms.  Last year did fine on Qvar but needs this refilled.  This week he has been fine but the week before last he had had a couple weeks of feeling some shortness of breath and uses albuterol some which helped.  Otherwise no complaints.  He is engaged.  He is in school at [employer] but also wants to pursue a 2-year business program  Reviewed their medical, surgical, family, social, medication, and allergy history and updated chart as appropriate.  Past Medical History:  Diagnosis Date  . Allergy   . Asthma     Past Surgical History:  Procedure Laterality Date  . NO PAST SURGERIES  06/2018    Social History   Socioeconomic History  . Marital status: Single    Spouse name: Not on file  . Number of children: Not on file  . Years of education: Not on file  . Highest education level: Not on file  Occupational History  . Not on file  Social Needs  . Financial resource strain: Not on file  . Food insecurity:    Worry: Not on file    Inability: Not on file  . Transportation needs:    Medical: Not on file    Non-medical: Not on file  Tobacco Use  . Smoking status: Never Smoker  . Smokeless tobacco: Never Used  Substance and Sexual Activity  . Alcohol use: Yes    Comment: occ  . Drug use: No  . Sexual activity: Not on file  Lifestyle  . Physical activity:    Days per week: Not on file    Minutes per session: Not on file  . Stress: Not on file  Relationships  . Social connections:    Talks on phone: Not on file    Gets together: Not on file    Attends  religious service: Not on file    Active member of club or organization: Not on file    Attends meetings of clubs or organizations: Not on file    Relationship status: Not on file  . Intimate partner violence:    Fear of current or ex partner: Not on file    Emotionally abused: Not on file    Physically abused: Not on file    Forced sexual activity: Not on file  Other Topics Concern  . Not on file  Social History Narrative   Supervisor at [employer], exercise - some.   Diet - somewhat healthy.   Girlfriend.  No children.  06/2018    Family History  Problem Relation Age of Onset  . Asthma Sister   . Diabetes Maternal Grandmother   . Hypertension Maternal Grandmother   . Heart disease Neg Hx   . Stroke Neg Hx   . Cancer Neg Hx      Current Outpatient Medications:  .  albuterol (PROAIR HFA) 108 (90 Base) MCG/ACT inhaler, Inhale 2 puffs into the lungs every 6 (six) hours as needed for wheezing or shortness of breath., Disp: 18 g, Rfl: 1 .  beclomethasone (QVAR REDIHALER) 80 MCG/ACT inhaler, Inhale 2 puffs into the lungs 2 (two) times daily., Disp: 10.6 g, Rfl: 5 .  cetirizine (ZYRTEC) 10 MG tablet, Take 1 tablet (10 mg total) by mouth daily., Disp: 30 tablet, Rfl: 11 .  pseudoephedrine (SUDAFED) 30 MG tablet, Take 30 mg by mouth every 4 (four) hours as needed for congestion., Disp: , Rfl:   Allergies  Allergen Reactions  . Shellfish-Derived Products      Review of Systems Constitutional: -fever, -chills, -sweats, -unexpected weight change, -decreased appetite, -fatigue Allergy: -sneezing, -itching, -congestion Dermatology: -changing moles, --rash, -lumps ENT: -runny nose, -ear pain, -sore throat, -hoarseness, -sinus pain, -teeth pain, - ringing in ears, -hearing loss, -nosebleeds Cardiology: -chest pain, -palpitations, -swelling, -difficulty breathing when lying flat, -waking up short of breath Respiratory: -cough, -shortness of breath, -difficulty breathing with exercise or  exertion, -wheezing, -coughing up blood Gastroenterology: -abdominal pain, -nausea, -vomiting, -diarrhea, -constipation, -blood in stool, -changes in bowel movement, -difficulty swallowing or eating Hematology: -bleeding, -bruising  Musculoskeletal: -joint aches, -muscle aches, -joint swelling, -back pain, -neck pain, -cramping, -changes in gait Ophthalmology: denies vision changes, eye redness, itching, discharge Urology: -burning with urination, -difficulty urinating, -blood in urine, -urinary frequency, -urgency, -incontinence Neurology: -headache, -weakness, -tingling, -numbness, -memory loss, -falls, -dizziness Psychology: -depressed mood, -agitation, -sleep problems Male GU: no testicular mass, pain, no lymph nodes swollen, no swelling, no rash.     Objective:  BP 110/70   Pulse 78   Temp 98.6 F (37 C) (Oral)   Resp 16   Ht  (1.803 m)   Wt 236 lb 9.6 oz (107.3 kg)   SpO2 98%   BMI 33.00 kg/m   General appearance: alert, no distress, WD/WN, African American male Skin: tattoos of wolf and forest left forearm, no worrisome lesions HEENT: normocephalic, conjunctiva/corneas normal, sclerae anicteric, PERRLA, EOMi, nares patent, no discharge or erythema, pharynx normal Oral cavity: MMM, tongue normal, teeth normal Neck: supple, no lymphadenopathy, no thyromegaly, no masses, normal ROM, no bruits Chest: non tender, normal shape and expansion Heart: RRR, normal S1, S2, no murmurs Lungs: CTA bilaterally, no wheezes, rhonchi, or rales Abdomen: +bs, soft, non tender, non distended, no masses, no hepatomegaly, no splenomegaly, no bruits Back: non tender, normal ROM, no scoliosis Musculoskeletal: upper extremities non tender, no obvious deformity, normal ROM throughout, lower extremities non tender, no obvious deformity, normal ROM throughout Extremities: no edema, no cyanosis, no clubbing Pulses: 2+ symmetric, upper and lower extremities, normal cap refill Neurological: alert,  oriented x 3, CN2-12 intact, strength normal upper extremities and lower extremities, sensation normal throughout, DTRs 2+ throughout, no cerebellar signs, gait normal Psychiatric: normal affect, behavior normal, pleasant  GU: normal male external genitalia,circumcised, nontender, no masses, no hernia, no lymphadenopathy Rectal: deferred   Assessment and Plan :   Encounter Diagnoses  Name Primary?  . Routine general medical examination at a health care facility Yes  . Moderate persistent extrinsic asthma without complication   . Allergic rhinitis due to pollen, unspecified seasonality   . Need for Td vaccine     Physical exam - discussed and counseled on healthy lifestyle, diet, exercise, preventative care, vaccinations, sick and well care, proper use of emergency dept and after hours care, and addressed their concerns.    Health screening: See your dentist yearly for routine dental care including hygiene visits twice yearly.  Cancer screening Advised monthly self testicular exam  Vaccinations: Advised yearly influenza vaccine Counseled on the Td (tetanus, diptheria) vaccine.  Vaccine  information sheet given. Td vaccine given after consent obtained.  Separate significant chronic issues discussed: Patient Instructions  Asthma and allergies  You seem to have a problem with asthma and allergies in the spring and fall but not so much the rest of the year  Here are the treatment recommendations  I recommend you use these treatment recommendations in early March lasting through April or even possibly may  Early May I would begin back on Qvar 1 puff twice daily.  Qvar is an inhaled steroid which is meant for maintenance of prevention of asthma flareups.  Qvar is not an emergency inhaler like albuterol.  Rinse your mouth out with water and swallow after using Qvar  In early May also begin back on Zyrtec once daily at bedtime allergy pill  You can use the albuterol rescue inhaler 2  puffs every 4-6 hours as needed for coughing spells, wheezing, shortness of breath or chest tightness  Repeat the same treatment recommendation in early September last seen third October for follow allergy season  If at any time your asthma symptoms or not controlled with the regimen above or if you are having trouble breathing in general please call or return    Zedrick was seen today for cpe.  Diagnoses and all orders for this visit:  Routine general medical examination at a health care facility -     POCT Urinalysis DIP (Proadvantage Device) -     CBC  Moderate persistent extrinsic asthma without complication -     albuterol (PROAIR HFA) 108 (90 Base) MCG/ACT inhaler; Inhale 2 puffs into the lungs every 6 (six) hours as needed for wheezing or shortness of breath.  Allergic rhinitis due to pollen, unspecified seasonality  Need for Td vaccine -     Td : Tetanus/diphtheria >7yo Preservative  free  Other orders -     beclomethasone (QVAR REDIHALER) 80 MCG/ACT inhaler; Inhale 2 puffs into the lungs 2 (two) times daily. -     cetirizine (ZYRTEC) 10 MG tablet; Take 1 tablet (10 mg total) by mouth daily.   Follow-up pending labs, yearly for physical

## 2018-06-15 ENCOUNTER — Encounter: Payer: Self-pay | Admitting: Medical

## 2018-06-17 LAB — WHITE BLOOD COUNT AND DIFFERENTIAL
Basophils Absolute: 0 10*3/uL (ref 0.0–0.2)
Basos: 0 %
EOS (ABSOLUTE): 0.1 10*3/uL (ref 0.0–0.4)
Eos: 1 %
Immature Grans (Abs): 0.1 10*3/uL (ref 0.0–0.1)
Immature Granulocytes: 1 %
Lymphocytes Absolute: 3.2 10*3/uL — ABNORMAL HIGH (ref 0.7–3.1)
Lymphs: 27 %
Monocytes Absolute: 0.8 10*3/uL (ref 0.1–0.9)
Monocytes: 7 %
Neutrophils Absolute: 7.6 10*3/uL — ABNORMAL HIGH (ref 1.4–7.0)
Neutrophils: 64 %
WBC: 11.8 10*3/uL — ABNORMAL HIGH (ref 3.4–10.8)

## 2018-06-17 LAB — SPECIMEN STATUS REPORT

## 2018-06-18 ENCOUNTER — Other Ambulatory Visit: Payer: Self-pay | Admitting: Medical

## 2018-06-18 DIAGNOSIS — D72829 Elevated white blood cell count, unspecified: Secondary | ICD-10-CM

## 2018-10-29 ENCOUNTER — Telehealth: Payer: Self-pay | Admitting: Medical

## 2018-10-29 NOTE — Telephone Encounter (Signed)
  Pt called stating that he wants to come in for blood work. He states that when he was here in May that we only did selected tests and he wants to know "his numbers on everything and his cholesterol

## 2018-10-30 ENCOUNTER — Other Ambulatory Visit: Payer: Self-pay | Admitting: Medical

## 2018-10-30 DIAGNOSIS — D72829 Elevated white blood cell count, unspecified: Secondary | ICD-10-CM

## 2018-10-30 NOTE — Telephone Encounter (Signed)
At his physical I didn't do the full panel we did last year as it wasn't necessary.  We checked cholesterol at his physical last year for a screening.  For a young man relatively healthy, we don't necessarily check cholesterol every year, which is related to heart disease screening, because unless he has several heart disease risk factors, we wouldn't necessarily put him on cholesterol medication at this time.  Thus, no reason to screen yearly for cholesterol.    He is due for repeat CBC blood counts at this time.   I think it would be reasonable to check CBC and maybe liver/kidney/lytes (CMET panel).   Is there other specific labs he wants to see at this time?  based on his physical back in May, I recommended a low cholesterol, low fat diet with efforts to lose weight.  This is the more importance recommendation from his physical.  How is he doing in relation to diet, exercise?

## 2018-10-30 NOTE — Telephone Encounter (Signed)
He can come for nurse visit.  Labs are in

## 2018-10-30 NOTE — Telephone Encounter (Signed)
Pt was informed of provider message. Pt stated he has not been dieting as he should he should but he went to the store and got some healthier food to eat. Patient stated he has been working for 6 weeks and he checked his weight yesterday and he has loss 15 pounds. Patient stated he will continue to work on his diet. He has scheduled his appointment for CBC and CMET.

## 2018-10-30 NOTE — Telephone Encounter (Signed)
His cholesterol was done in 2019  For the labs that he is due for, does he need just lab appointment or does he need to see you?

## 2018-10-31 ENCOUNTER — Other Ambulatory Visit: Payer: No Typology Code available for payment source

## 2018-12-01 ENCOUNTER — Other Ambulatory Visit: Payer: Self-pay | Admitting: Medical

## 2018-12-01 DIAGNOSIS — J4540 Moderate persistent asthma, uncomplicated: Secondary | ICD-10-CM

## 2019-05-27 ENCOUNTER — Other Ambulatory Visit: Payer: Self-pay | Admitting: Medical

## 2019-05-27 ENCOUNTER — Telehealth: Payer: Self-pay | Admitting: Medical

## 2019-05-27 MED ORDER — QVAR REDIHALER 80 MCG/ACT IN AERB: 2.0000 | INHALATION_SPRAY | Freq: Two times a day (BID) | RESPIRATORY_TRACT | 0 refills | Status: DC

## 2019-05-27 NOTE — Telephone Encounter (Signed)
Pt called for refills of QVAR. He did schedule a CPE  In May while on the phone. He states he is completely out of this medication and only uses seasonally. [facility] [address] and can be reached at [phone].

## 2019-05-30 ENCOUNTER — Telehealth: Payer: Self-pay

## 2019-05-30 NOTE — Telephone Encounter (Signed)
Pt. Called stating he is still having trouble getting his [facility] inhaler filed [facility] the [facility] and wanted to know if you could send it in to the [place] [facility] on [address].

## 2019-05-31 ENCOUNTER — Other Ambulatory Visit: Payer: Self-pay | Admitting: Medical

## 2019-05-31 MED ORDER — QVAR REDIHALER 80 MCG/ACT IN AERB: 2.0000 | INHALATION_SPRAY | Freq: Two times a day (BID) | RESPIRATORY_TRACT | 2 refills | Status: DC

## 2019-06-08 HISTORY — PX: NO PAST SURGERIES: SHX2092

## 2019-06-24 ENCOUNTER — Ambulatory Visit: Payer: 59 | Admitting: Medical

## 2019-06-24 ENCOUNTER — Other Ambulatory Visit: Payer: Self-pay

## 2019-06-24 ENCOUNTER — Encounter: Payer: Self-pay | Admitting: Medical

## 2019-06-24 VITALS — BP 132/80 | HR 81 | Temp 98.4°F | Ht 69.25 in | Wt 201.0 lb

## 2019-06-24 DIAGNOSIS — J301 Allergic rhinitis due to pollen: Secondary | ICD-10-CM

## 2019-06-24 DIAGNOSIS — J4540 Moderate persistent asthma, uncomplicated: Secondary | ICD-10-CM

## 2019-06-24 DIAGNOSIS — Z7185 Encounter for immunization safety counseling: Secondary | ICD-10-CM

## 2019-06-24 DIAGNOSIS — Z0000 Encounter for general adult medical examination without abnormal findings: Secondary | ICD-10-CM | POA: Diagnosis not present

## 2019-06-24 DIAGNOSIS — Z13220 Encounter for screening for lipoid disorders: Secondary | ICD-10-CM | POA: Diagnosis not present

## 2019-06-24 DIAGNOSIS — Z7189 Other specified counseling: Secondary | ICD-10-CM

## 2019-06-24 MED ORDER — QVAR REDIHALER 80 MCG/ACT IN AERB: 2.0000 | INHALATION_SPRAY | Freq: Two times a day (BID) | RESPIRATORY_TRACT | 3 refills | Status: DC

## 2019-06-24 MED ORDER — ALBUTEROL SULFATE HFA 108 (90 BASE) MCG/ACT IN AERS: INHALATION_SPRAY | RESPIRATORY_TRACT | 1 refills | Status: DC

## 2019-06-24 NOTE — Progress Notes (Signed)
Subjective:   HPI  Daniel Wood is a 24 y.o. male who presents for Chief Complaint  Patient presents with  . Annual Exam    CPE     Patient Care Team: Tysinger, Cleda Mccreedy as PCP - General (Family Medicine) Sees dentist Sees eye doctor  Concerns: Doing well.   He ended up losing over 30 pounds in the past year intentionally with healthy diet and having a job that is very active.  He is getting married within the next 30 days.  Will be married in [facility] , will be spending honeymoon at [facility]  Asthma-doing relatively well.  Occasionally uses albuterol but mainly uses Qvar preventative during spring and fall  Reviewed their medical, surgical, family, social, medication, and allergy history and updated chart as appropriate.  Past Medical History:  Diagnosis Date  . Allergy   . Asthma     Past Surgical History:  Procedure Laterality Date  . NO PAST SURGERIES  06/2019    Social History   Socioeconomic History  . Marital status: Single    Spouse name: Not on file  . Number of children: Not on file  . Years of education: Not on file  . Highest education level: Not on file  Occupational History  . Not on file  Tobacco Use  . Smoking status: Never Smoker  . Smokeless tobacco: Never Used  Substance and Sexual Activity  . Alcohol use: Yes    Comment: occ  . Drug use: No  . Sexual activity: Not on file  Other Topics Concern  . Not on file  Social History Narrative   Was Supervisor at [employer], working now at [employer].  Exercise - lots of activity on the job.   Diet - somewhat healthy.   Has fiance, they have been together since age 21yo.  No children.  06/2019   Social Determinants of Health   Financial Resource Strain:   . Difficulty of Paying Living Expenses:   Food Insecurity:   . Worried About Programme researcher, broadcasting/film/video in the Last Year:   . Barista in the Last Year:   Transportation Needs:   . Freight forwarder  (Medical):   Marland Kitchen Lack of Transportation (Non-Medical):   Physical Activity:   . Days of Exercise per Week:   . Minutes of Exercise per Session:   Stress:   . Feeling of Stress :   Social Connections:   . Frequency of Communication with Friends and Family:   . Frequency of Social Gatherings with Friends and Family:   . Attends Religious Services:   . Active Member of Clubs or Organizations:   . Attends Banker Meetings:   Marland Kitchen Marital Status:   Intimate Partner Violence:   . Fear of Current or Ex-Partner:   . Emotionally Abused:   Marland Kitchen Physically Abused:   . Sexually Abused:     Family History  Problem Relation Age of Onset  . Asthma Sister   . Diabetes Maternal Grandmother   . Hypertension Maternal Grandmother   . Heart disease Neg Hx   . Stroke Neg Hx   . Cancer Neg Hx      Current Outpatient Medications:  .  albuterol (VENTOLIN HFA) 108 (90 Base) MCG/ACT inhaler, TAKE 2 PUFFS BY MOUTH EVERY 6 HOURS AS NEEDED FOR WHEEZE OR SHORTNESS OF BREATH, Disp: 18 g, Rfl: 1 .  cetirizine (ZYRTEC) 10 MG tablet, Take 1 tablet (10 mg total)  by mouth daily., Disp: 30 tablet, Rfl: 11 .  beclomethasone (QVAR REDIHALER) 80 MCG/ACT inhaler, Inhale 2 puffs into the lungs 2 (two) times daily., Disp: 10.6 g, Rfl: 3  Allergies  Allergen Reactions  . Shellfish-Derived Products     Review of Systems Constitutional: -fever, -chills, -sweats, -unexpected weight change, -decreased appetite, -fatigue Allergy: -sneezing, -itching, -congestion Dermatology: -changing moles, --rash, -lumps ENT: -runny nose, -ear pain, -sore throat, -hoarseness, -sinus pain, -teeth pain, - ringing in ears, -hearing loss, -nosebleeds Cardiology: -chest pain, -palpitations, -swelling, -difficulty breathing when lying flat, -waking up short of breath Respiratory: -cough, -shortness of breath, -difficulty breathing with exercise or exertion, -wheezing, -coughing up blood Gastroenterology: -abdominal pain, -nausea,  -vomiting, -diarrhea, -constipation, -blood in stool, -changes in bowel movement, -difficulty swallowing or eating Hematology: -bleeding, -bruising  Musculoskeletal: -joint aches, -muscle aches, -joint swelling, -back pain, -neck pain, -cramping, -changes in gait Ophthalmology: denies vision changes, eye redness, itching, discharge Urology: -burning with urination, -difficulty urinating, -blood in urine, -urinary frequency, -urgency, -incontinence Neurology: -headache, -weakness, -tingling, -numbness, -memory loss, -falls, -dizziness Psychology: -depressed mood, -agitation, -sleep problems Male GU: no testicular mass, pain, no lymph nodes swollen, no swelling, no rash.     Objective:  BP 132/80   Pulse 81   Temp 98.4 F (36.9 C)   Ht 5' 9.25" (1.759 m)   Wt 201 lb (91.2 kg)   BMI 29.47 kg/m   General appearance: alert, no distress, WD/WN, African American male Skin: tattoos of wolf and forest left forearm, no worrisome lesions HEENT: normocephalic, conjunctiva/corneas normal, sclerae anicteric, PERRLA, EOMi, nares patent, no discharge or erythema, pharynx normal Oral cavity: MMM, tongue normal, teeth normal Neck: supple, no lymphadenopathy, no thyromegaly, no masses, normal ROM, no bruits Chest: non tender, normal shape and expansion Heart: RRR, normal S1, S2, no murmurs Lungs: CTA bilaterally, no wheezes, rhonchi, or rales Abdomen: +bs, soft, non tender, non distended, no masses, no hepatomegaly, no splenomegaly, no bruits Back: non tender, normal ROM, no scoliosis Musculoskeletal: upper extremities non tender, no obvious deformity, normal ROM throughout, lower extremities non tender, no obvious deformity, normal ROM throughout Extremities: no edema, no cyanosis, no clubbing Pulses: 2+ symmetric, upper and lower extremities, normal cap refill Neurological: alert, oriented x 3, CN2-12 intact, strength normal upper extremities and lower extremities, sensation normal throughout, DTRs  2+ throughout, no cerebellar signs, gait normal Psychiatric: normal affect, behavior normal, pleasant  GU: normal male external genitalia,circumcised, nontender, no masses, no hernia, no lymphadenopathy Rectal: deferred   Assessment and Plan :   Encounter Diagnoses  Name Primary?  . Routine general medical examination at a health care facility Yes  . Vaccine counseling   . Allergic rhinitis due to pollen, unspecified seasonality   . Moderate persistent extrinsic asthma without complication   . Screening for lipid disorders     Physical exam - discussed and counseled on healthy lifestyle, diet, exercise, preventative care, vaccinations, sick and well care, proper use of emergency dept and after hours care, and addressed their concerns.    Health screening: See your dentist yearly for routine dental care including hygiene visits twice yearly.  Cancer screening Advised monthly self testicular exam  Vaccinations: Advised yearly influenza vaccine Advised covid vaccine  Separate significant chronic issues discussed: Asthma -continue Qvar preventatively spring and fall otherwise albuterol as needed, avoid triggers   There are no Patient Instructions on file for this visit.   Prestyn was seen today for annual exam.  Diagnoses and all orders for this visit:  Routine general medical examination at a health care facility -     Comprehensive metabolic panel -     CBC with Differential/Platelet -     Lipid panel  Vaccine counseling  Allergic rhinitis due to pollen, unspecified seasonality  Moderate persistent extrinsic asthma without complication -     Comprehensive metabolic panel -     CBC with Differential/Platelet -     albuterol (VENTOLIN HFA) 108 (90 Base) MCG/ACT inhaler; TAKE 2 PUFFS BY MOUTH EVERY 6 HOURS AS NEEDED FOR WHEEZE OR SHORTNESS OF BREATH  Screening for lipid disorders -     Lipid panel  Other orders -     beclomethasone (QVAR REDIHALER) 80 MCG/ACT  inhaler; Inhale 2 puffs into the lungs 2 (two) times daily.   Follow-up pending labs, yearly for physical

## 2019-06-25 LAB — CBC WITH DIFFERENTIAL/PLATELET
Basophils Absolute: 0 10*3/uL (ref 0.0–0.2)
Basos: 0 %
EOS (ABSOLUTE): 0.1 10*3/uL (ref 0.0–0.4)
Eos: 1 %
Hematocrit: 46.4 % (ref 37.5–51.0)
Hemoglobin: 15.2 g/dL (ref 13.0–17.7)
Immature Grans (Abs): 0 10*3/uL (ref 0.0–0.1)
Immature Granulocytes: 0 %
Lymphocytes Absolute: 2.4 10*3/uL (ref 0.7–3.1)
Lymphs: 28 %
MCH: 28.5 pg (ref 26.6–33.0)
MCHC: 32.8 g/dL (ref 31.5–35.7)
MCV: 87 fL (ref 79–97)
Monocytes Absolute: 1.2 10*3/uL — ABNORMAL HIGH (ref 0.1–0.9)
Monocytes: 14 %
Neutrophils Absolute: 4.9 10*3/uL (ref 1.4–7.0)
Neutrophils: 57 %
Platelets: 221 10*3/uL (ref 150–450)
RBC: 5.33 x10E6/uL (ref 4.14–5.80)
RDW: 13.2 % (ref 11.6–15.4)
WBC: 8.7 10*3/uL (ref 3.4–10.8)

## 2019-06-25 LAB — COMPREHENSIVE METABOLIC PANEL
ALT: 51 IU/L — ABNORMAL HIGH (ref 0–44)
AST: 52 IU/L — ABNORMAL HIGH (ref 0–40)
Albumin/Globulin Ratio: 1.5 (ref 1.2–2.2)
Albumin: 4.6 g/dL (ref 4.1–5.2)
Alkaline Phosphatase: 104 IU/L (ref 48–121)
BUN/Creatinine Ratio: 23 — ABNORMAL HIGH (ref 9–20)
BUN: 20 mg/dL (ref 6–20)
Bilirubin Total: 0.6 mg/dL (ref 0.0–1.2)
CO2: 24 mmol/L (ref 20–29)
Calcium: 9.4 mg/dL (ref 8.7–10.2)
Chloride: 101 mmol/L (ref 96–106)
Creatinine, Ser: 0.86 mg/dL (ref 0.76–1.27)
GFR calc Af Amer: 141 mL/min/{1.73_m2} (ref >59)
GFR calc non Af Amer: 122 mL/min/{1.73_m2} (ref >59)
Globulin, Total: 3 g/dL (ref 1.5–4.5)
Glucose: 77 mg/dL (ref 65–99)
Potassium: 4.3 mmol/L (ref 3.5–5.2)
Sodium: 137 mmol/L (ref 134–144)
Total Protein: 7.6 g/dL (ref 6.0–8.5)

## 2019-06-25 LAB — LIPID PANEL
Chol/HDL Ratio: 2.8 ratio (ref 0.0–5.0)
Cholesterol, Total: 164 mg/dL (ref 100–199)
HDL: 59 mg/dL (ref >39)
LDL Chol Calc (NIH): 92 mg/dL (ref 0–99)
Triglycerides: 67 mg/dL (ref 0–149)
VLDL Cholesterol Cal: 13 mg/dL (ref 5–40)

## 2019-06-26 ENCOUNTER — Other Ambulatory Visit: Payer: Self-pay | Admitting: Medical

## 2019-06-26 DIAGNOSIS — R7989 Other specified abnormal findings of blood chemistry: Secondary | ICD-10-CM

## 2019-08-19 ENCOUNTER — Other Ambulatory Visit: Payer: Self-pay | Admitting: Medical

## 2019-08-19 ENCOUNTER — Telehealth: Payer: Self-pay

## 2019-08-19 DIAGNOSIS — J4540 Moderate persistent asthma, uncomplicated: Secondary | ICD-10-CM

## 2019-08-19 MED ORDER — ALBUTEROL SULFATE HFA 108 (90 BASE) MCG/ACT IN AERS: INHALATION_SPRAY | RESPIRATORY_TRACT | 1 refills | Status: DC

## 2019-08-19 MED ORDER — CETIRIZINE HCL 10 MG PO TABS: 10.0000 mg | ORAL_TABLET | Freq: Every day | ORAL | 11 refills | Status: DC

## 2019-08-19 MED ORDER — QVAR REDIHALER 80 MCG/ACT IN AERB: 2.0000 | INHALATION_SPRAY | Freq: Two times a day (BID) | RESPIRATORY_TRACT | 5 refills | Status: DC

## 2019-08-19 MED FILL — ALBUTEROL SULFATE HFA 108 (: 108 (90 BAS | 25 days supply | Qty: 18 | Fill #0

## 2019-08-19 NOTE — Telephone Encounter (Signed)
done

## 2019-08-19 NOTE — Telephone Encounter (Signed)
Pt. Called stating that he needs all of his meds sent in to the [facility] pt. Pharmacy per his new insurance. Pt. Last apt was 06/24/19. Next apt. 06/24/20.

## 2019-08-20 ENCOUNTER — Other Ambulatory Visit: Payer: Self-pay | Admitting: Medical

## 2019-08-20 ENCOUNTER — Telehealth: Payer: Self-pay | Admitting: Medical

## 2019-08-20 MED ORDER — PULMICORT FLEXHALER 90 MCG/ACT IN AEPB: 1.0000 | INHALATION_SPRAY | Freq: Two times a day (BID) | RESPIRATORY_TRACT | 5 refills | Status: DC

## 2019-08-20 NOTE — Telephone Encounter (Signed)
Sent message via mychart

## 2019-08-20 NOTE — Telephone Encounter (Signed)
I received the medication rejection from [facility] that Qvar is not covered  I sent alternate called Pulmicort  If this happens to not be covered and he will need to call the insurance to find out which ones they do prefer to we will so

## 2019-08-27 ENCOUNTER — Telehealth: Payer: Self-pay | Admitting: Internal Medicine

## 2019-08-27 ENCOUNTER — Other Ambulatory Visit: Payer: Self-pay | Admitting: Medical

## 2019-08-27 MED ORDER — FLUTICASONE PROPIONATE HFA 44 MCG/ACT IN AERO
2.0000 | INHALATION_SPRAY | Freq: Two times a day (BID) | RESPIRATORY_TRACT | 2 refills | Status: DC
Start: 2019-08-27 — End: 2019-12-27

## 2019-08-27 NOTE — Telephone Encounter (Signed)
[facility] called and states that they fax over something to switch patient from pulmicort to flovent but you did not specify which dose. I do not see anything in chart about this. Please document what dose- , , and send in to [facility]

## 2019-08-28 MED FILL — FLOVENT HFA 44 MCG INHALER: 44 | 30 days supply | Qty: 11 | Fill #0

## 2019-10-14 DIAGNOSIS — Z03818 Encounter for observation for suspected exposure to other biological agents ruled out: Secondary | ICD-10-CM | POA: Diagnosis not present

## 2019-10-15 DIAGNOSIS — Z03818 Encounter for observation for suspected exposure to other biological agents ruled out: Secondary | ICD-10-CM | POA: Diagnosis not present

## 2019-10-30 ENCOUNTER — Encounter: Payer: Self-pay | Admitting: Medical

## 2019-10-30 ENCOUNTER — Ambulatory Visit (INDEPENDENT_AMBULATORY_CARE_PROVIDER_SITE_OTHER): Payer: 59 | Admitting: Medical

## 2019-10-30 VITALS — BP 136/90 | HR 73 | Ht 69.25 in | Wt 229.4 lb

## 2019-10-30 DIAGNOSIS — G479 Sleep disorder, unspecified: Secondary | ICD-10-CM | POA: Diagnosis not present

## 2019-10-30 DIAGNOSIS — F5102 Adjustment insomnia: Secondary | ICD-10-CM

## 2019-10-30 MED ORDER — ZOLPIDEM TARTRATE 10 MG PO TABS: ORAL_TABLET | ORAL | 0 refills | Status: DC

## 2019-10-30 MED FILL — FLOVENT HFA 44 MCG INHALER: 44 | 30 days supply | Qty: 11 | Fill #1

## 2019-10-30 MED FILL — ZOLPIDEM TARTRATE 10 MG TAB: 10 | 20 days supply | Qty: 15 | Fill #0

## 2019-10-30 MED FILL — ALBUTEROL SULFATE HFA 108 (: 108 (90 BAS | 25 days supply | Qty: 18 | Fill #0

## 2019-10-30 NOTE — Progress Notes (Signed)
Established patient visit   Patient: Daniel Wood   DOB: 07-Jun-1995   24 y.o. Male  MRN: [id number] Visit Date: 10/30/2019  Today's healthcare provider: Kristian Covey, PA-C   Chief Complaint  Patient presents with  . Insomnia  I,Shane Tysinger,acting as a Neurosurgeon for [employer], PA-C.,have documented all relevant documentation on the behalf of Kristian Covey, PA-C,as directed by  [employer], PA-C while in the presence of [employer], PA-C.  Subjective    Insomnia Primary symptoms: sleep disturbance, difficulty falling asleep, frequent awakening.  The current episode started more than one month. The problem occurs nightly. The problem has been gradually worsening since onset. The symptoms are aggravated by work stress and bed partner. Past treatments include medication (sleep tea and melatonin). The treatment provided mild relief. Typical bedtime:  Other (1:30 AM per pt).   Patient reports going to bed around 1:30 AM. He reports that he used to go to bed at 9:00 PM. 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 Have to be work at 3:45 AM, 12 hr shifts. 6:30 PM on Monday & Wednesdays for boot camp for exercising.   Medications: Outpatient Medications Prior to Visit  Medication Sig  . albuterol (VENTOLIN HFA) 108 (90 Base) MCG/ACT inhaler TAKE 2 PUFFS BY MOUTH EVERY 6 HOURS AS NEEDED FOR WHEEZE OR SHORTNESS OF BREATH  . cetirizine (ZYRTEC) 10 MG tablet Take 1 tablet (10 mg total) by mouth daily.  . fluticasone (FLOVENT HFA) 44 MCG/ACT inhaler Inhale 2 puffs into the lungs 2 (two) times daily.   No facility-administered medications prior to visit.    Review of Systems  Constitutional: Negative.   Respiratory: Negative.   Psychiatric/Behavioral: Positive for sleep disturbance. The patient has insomnia.       Objective    BP 136/90   Pulse 73   Ht 5'  9.25" (1.759 m)   Wt 229 lb 6.4 oz (104.1 kg)   SpO2 96%   BMI 33.63 kg/m   Physical Exam Constitutional:      Appearance: Normal appearance.  Neurological:     General: No focal deficit present.     Mental Status: He is alert and oriented to person, place, and time.  Psychiatric:        Mood and Affect: Mood normal.        Behavior: Behavior normal.        Assessment & Plan      we discussed his situation.  He unfortunately has a very early wake up time for his work days.  However he is spending some nights on the TV too late, and he and wife do [employer] 2 nights a week that tend to push him to bedtime.  We discussed trying to get consistent every single day including weekend.  He will focus more on getting in the bed 7:30 to 8 PM most nights.  Patient was advised to control going to bed early, work on eating habits, screen time, and exercising. We discussed some sleep aid options short-term:Trazodone, Ambien. Also he can try Tylenol PM or benadryl.  After discussing these he wants to try Ambien zolpidem short-term.  Discussed proper use, risk and benefits.  Follow-up in 2 weeks   Daniel Wood was seen today for insomnia.  Diagnoses and all orders for this visit:  Adjustment insomnia  Sleep disturbance  Other orders -  zolpidem (AMBIEN) 10 MG tablet; Started with 1/2 tablet (5 mg) nightly, then increase to 10 mg nightly.     Kristian Covey, PA-C  [facility] [phone] (phone) [phone] (fax)  [facility]

## 2019-12-27 ENCOUNTER — Encounter: Payer: Self-pay | Admitting: Medical

## 2019-12-27 ENCOUNTER — Ambulatory Visit (INDEPENDENT_AMBULATORY_CARE_PROVIDER_SITE_OTHER): Payer: 59 | Admitting: Medical

## 2019-12-27 ENCOUNTER — Other Ambulatory Visit: Payer: Self-pay

## 2019-12-27 ENCOUNTER — Other Ambulatory Visit: Payer: Self-pay | Admitting: Medical

## 2019-12-27 VITALS — BP 130/84 | HR 72 | Temp 98.5°F | Wt 227.8 lb

## 2019-12-27 DIAGNOSIS — K30 Functional dyspepsia: Secondary | ICD-10-CM | POA: Diagnosis not present

## 2019-12-27 DIAGNOSIS — J4540 Moderate persistent asthma, uncomplicated: Secondary | ICD-10-CM | POA: Diagnosis not present

## 2019-12-27 DIAGNOSIS — K219 Gastro-esophageal reflux disease without esophagitis: Secondary | ICD-10-CM | POA: Diagnosis not present

## 2019-12-27 MED ORDER — ALBUTEROL SULFATE HFA 108 (90 BASE) MCG/ACT IN AERS: INHALATION_SPRAY | RESPIRATORY_TRACT | 1 refills | Status: DC

## 2019-12-27 MED ORDER — FLUTICASONE PROPIONATE HFA 44 MCG/ACT IN AERO: 2.0000 | INHALATION_SPRAY | Freq: Two times a day (BID) | RESPIRATORY_TRACT | 5 refills | Status: DC

## 2019-12-27 MED ORDER — OMEPRAZOLE 40 MG PO CPDR: 40.0000 mg | DELAYED_RELEASE_CAPSULE | Freq: Every day | ORAL | 1 refills | Status: DC

## 2019-12-27 MED FILL — ALBUTEROL SULFATE HFA 108 (: 108 (90 BAS | 25 days supply | Qty: 18 | Fill #0

## 2019-12-27 MED FILL — OMEPRAZOLE 40 MG CPDR: 40 | 30 days supply | Qty: 30 | Fill #0

## 2019-12-27 MED FILL — FLOVENT HFA 44 MCG INHALER: 44 | 30 days supply | Qty: 11 | Fill #0

## 2019-12-27 NOTE — Patient Instructions (Signed)
Gastroesophageal Reflux Disease, Adult   Gastroesophageal reflux disease (GERD) happens when acid from your stomach flows up into the esophagus. When acid comes in contact with the esophagus, the acid causes soreness (inflammation) in the esophagus. Over time, GERD may create small holes (ulcers) in the lining of the esophagus.  CAUSES   Increased body weight. This puts pressure on the stomach, making acid rise from the stomach into the esophagus.   Smoking. This increases acid production in the stomach.   Drinking alcohol. This causes decreased pressure in the lower esophageal sphincter (valve or ring of muscle between the esophagus and stomach), allowing acid from the stomach into the esophagus.   Late evening meals and a full stomach. This increases pressure and acid production in the stomach.   A malformed lower esophageal sphincter.  Sometimes, no cause is found.  SYMPTOMS   Burning pain in the lower part of the mid-chest behind the breastbone and in the mid-stomach area. This may occur twice a week or more often.   Trouble swallowing.   Sore throat.   Dry cough.   Asthma-like symptoms including chest tightness, shortness of breath, or wheezing.   DIAGNOSIS  Your caregiver may be able to diagnose GERD based on your symptoms. In some cases, X-rays and other tests may be done to check for complications or to check the condition of your stomach and esophagus.  TREATMENT   You may use Omeprazole 40mg  daily 30-45 minutes before breakfast x 2 weeks, longer if needed  You can use Pepto Bismol 1-2 times daily as needed for flare up  You can still use Pepcid/Famotidine at bedtime for the next 2 weeks if needed  You can keep Tums on hand for as needed use as well  Try to reduce acidic and spicy foods    HOME CARE INSTRUCTIONS   Change the factors that you can control. Ask your caregiver for guidance concerning weight loss, quitting smoking, and alcohol consumption.    Avoid foods and drinks that make your symptoms worse, such as:   Caffeine or alcoholic drinks.   Chocolate.   Peppermint or mint flavorings.   Garlic and onions.   Spicy foods.   Citrus fruits, such as oranges, lemons, or limes.   Tomato-based foods such as sauce, chili, salsa, and pizza.   Fried and fatty foods.   Avoid lying down for the 3 hours prior to your bedtime or prior to taking a nap.   Eat small, frequent meals instead of large meals.   Wear loose-fitting clothing. Do not wear anything tight around your waist that causes pressure on your stomach.   Raise the head of your bed 6 to 8 inches with wood blocks to help you sleep. Extra pillows will not help.   Only take over-the-counter or prescription medicines for pain, discomfort, or fever as directed by your caregiver.   Do not take aspirin, ibuprofen, or other nonsteroidal anti-inflammatory drugs (NSAIDs).   SEEK IMMEDIATE MEDICAL CARE IF:   You have pain in your arms, neck, jaw, teeth, or back.   Your pain increases or changes in intensity or duration.   You develop nausea, vomiting, or sweating (diaphoresis).   You develop shortness of breath, or you faint.   Your vomit is green, yellow, black, or looks like coffee grounds or blood.   Your stool is red, bloody, or black.  These symptoms could be signs of other problems, such as heart disease, gastric bleeding, or esophageal bleeding. MAKE SURE YOU:  Understand these instructions.   Will watch your condition.   Will get help right away if you are not doing well or get worse.  Document Released: 11/03/2004 Document Revised: 10/06/2010 Document Reviewed: 08/13/2010 [facility] Patient Information 2012 [place].

## 2019-12-27 NOTE — Progress Notes (Signed)
Subjective:  Daniel Wood is a 24 y.o. male who presents for Chief Complaint  Patient presents with  . other    acid reflux and heart burn started with fried food or tomato sauce for the past year more often now     Here for acid reflux.  He gets this from time to time in the last few days it has been much worse. Last night awake midnight with pain, indigestion.  Used some pepto bismol.  Ate some pizza right before bed last night.  Stomach still hurting at 2am.  Using antacids as needed.  Needs refills on inhalers.  Overall doing okay on current medication but does use albuterol most days per week once  No other aggravating or relieving factors.    No other c/o.  The following portions of the patient's history were reviewed and updated as appropriate: allergies, current medications, past family history, past medical history, past social history, past surgical history and problem list.  ROS Otherwise as in subjective above    Objective: BP 130/84   Pulse 72   Temp 98.5 F (36.9 C)   Wt 227 lb 12.8 oz (103.3 kg)   BMI 33.40 kg/m   General appearance: alert, no distress, well developed, well nourished tattoos bilat forearms, tree tattoo left forearm Heart: RRR, normal S1, S2, no murmurs Lungs: CTA bilaterally, no wheezes, rhonchi, or rales Abdomen: +bs, soft, non tender, non distended, no masses, no hepatomegaly, no splenomegaly Pulses: 2+ radial pulses, 2+ pedal pulses, normal cap refill Ext: no edema   Assessment: Encounter Diagnoses  Name Primary?  . Gastroesophageal reflux disease, unspecified whether esophagitis present Yes  . Moderate persistent extrinsic asthma without complication   . Indigestion      Plan: GERD, indigestion-avoid acid triggers.  Avoid eating close to bedtime.   Avoid eating too fast.  Begin Omeprazole, can use Pepto bismol and Tums prn.  Can stop PPI if resolved in 2-3 weeks.  If not improving, then recheck  Asthma - c/t Flovent for  prevention, albuterol prn.     [name] was seen today for other.  Diagnoses and all orders for this visit:  Gastroesophageal reflux disease, unspecified whether esophagitis present  Moderate persistent extrinsic asthma without complication -     albuterol (VENTOLIN HFA) 108 (90 Base) MCG/ACT inhaler; TAKE 2 PUFFS BY MOUTH EVERY 6 HOURS AS NEEDED FOR WHEEZE OR SHORTNESS OF BREATH  Indigestion  Other orders -     omeprazole (PRILOSEC) 40 MG capsule; Take 1 capsule (40 mg total) by mouth daily. -     fluticasone (FLOVENT HFA) 44 MCG/ACT inhaler; Inhale 2 puffs into the lungs 2 (two) times daily.    Follow up: prn

## 2020-02-06 ENCOUNTER — Telehealth: Payer: 59 | Admitting: Medical

## 2020-02-06 ENCOUNTER — Encounter: Payer: Self-pay | Admitting: Medical

## 2020-02-06 ENCOUNTER — Other Ambulatory Visit (INDEPENDENT_AMBULATORY_CARE_PROVIDER_SITE_OTHER): Payer: 59

## 2020-02-06 VITALS — Wt 225.0 lb

## 2020-02-06 DIAGNOSIS — R197 Diarrhea, unspecified: Secondary | ICD-10-CM | POA: Diagnosis not present

## 2020-02-06 DIAGNOSIS — R059 Cough, unspecified: Secondary | ICD-10-CM

## 2020-02-06 DIAGNOSIS — R509 Fever, unspecified: Secondary | ICD-10-CM

## 2020-02-06 DIAGNOSIS — Z20822 Contact with and (suspected) exposure to covid-19: Secondary | ICD-10-CM

## 2020-02-06 LAB — POC COVID19 BINAXNOW: SARS Coronavirus 2 Ag: POSITIVE — AB

## 2020-02-06 NOTE — Progress Notes (Signed)
Subjective:     Patient ID: Daniel Wood, male   DOB: 10-26-1995, 24 y.o.   MRN: [id number]  This visit type was conducted due to national recommendations for restrictions regarding the COVID-19 Pandemic (e.g. social distancing) in an effort to limit this patient's exposure and mitigate transmission in our community.  Due to their co-morbid illnesses, this patient is at least at moderate risk for complications without adequate follow up.  This format is felt to be most appropriate for this patient at this time.    Documentation for virtual audio and video telecommunications through [place] encounter:  The patient was located at home. The provider was located in the office. The patient did consent to this visit and is aware of possible charges through their insurance for this visit.  The other persons participating in this telemedicine service were none. Time spent on call was 20 minutes and in review of previous records 20 minutes total.  This virtual service is not related to other E/M service within previous 7 days.   HPI Chief Complaint  Patient presents with  . sick    Headache, ear pain, diarrhea, nausea, congestion. Having to use inhaler more often. Diarrhea started 5 days ago but all other symptoms started this morning.    Virtual consult for illness.  Last week brother was sick, and he tested positive for covid this past Saturday.  Daniel Wood helped his brother move in last week was around him during the time he had symptoms. Daniel Wood notes diarrhea the last several days, and in the past 24 hours his started having fever, body aches, chills, headache, ear pressure, sore throat mild, head congestion and mucus drainage.  Some nausea.  Using some Advil.  Has used albuterol a few times more than usual  Otherwise in normal state of health.  Hydrating well.  No other aggravating or relieving factors. No other complaint.  Past Medical History:  Diagnosis Date  . Allergy   . Asthma      Review of Systems As in subjective    Objective:   Physical Exam Due to coronavirus pandemic stay at home measures, patient visit was virtual and they were not examined in person.   Wt 225 lb (102.1 kg)   BMI 32.99 kg/m   General: Well-developed well-nourished no acute distress No obvious dyspnea or wheezing Answers questions in complete sentences    Assessment:     Encounter Diagnoses  Name Primary?  . Cough Yes  . Fever, unspecified fever cause   . Diarrhea, unspecified type   . Close exposure to COVID-19 virus        Plan:     We discussed symptoms and concerns and the exposure.  Advised he is same he is positive given the exposure and new symptoms.  He still wants to come in for testing today.  General recommendations: I recommend you rest, hydrate well with water and clear fluids throughout the day.   You can use Tylenol for pain or fever You can use over the counter Delsym for cough. You can use over the counter Emetrol for nausea.  Use albuterol 2 puffs every 4-6 hours as needed given his underlying asthma  If you are having trouble breathing, if you are very weak, have high fever 103 or higher consistently despite Tylenol, or uncontrollable nausea and vomiting, then call or go to the emergency department.    Covid symptoms such as fatigue and cough can linger over 2 weeks, even after the initial fever,  aches, chills, and other initial symptoms.  We discussed quarantine and the new CDC guidelines and came out this week that could reduce the amount of time and quarantine assuming symptoms resolve fairly quickly  Overall if worse or not improving in the coming days then recheck  Daniel Wood was seen today for sick.  Diagnoses and all orders for this visit:  Cough -     POC COVID-19 BinaxNow; Future  Fever, unspecified fever cause -     POC COVID-19 BinaxNow; Future  Diarrhea, unspecified type -     POC COVID-19 BinaxNow; Future  Close exposure to  COVID-19 virus -     POC COVID-19 BinaxNow; Future  f/u in back parking lot this morning for screening

## 2020-02-08 DIAGNOSIS — E119 Type 2 diabetes mellitus without complications: Secondary | ICD-10-CM

## 2020-02-08 HISTORY — DX: Type 2 diabetes mellitus without complications: E11.9

## 2020-02-12 ENCOUNTER — Telehealth: Payer: Self-pay

## 2020-02-12 ENCOUNTER — Encounter: Payer: Self-pay | Admitting: Medical

## 2020-02-12 NOTE — Telephone Encounter (Signed)
Pt. Called stating he tested positive for covid here on 02/06/20 he is supposed to return to work on Friday 02/14/20. He wanted to get your opinion on when he should be ok to return to work or if he needs to stay out longer. He also said they required him to have a doctor's note stated it is ok for him to return to work and also a negative rapid covid test result before he could return to work.

## 2020-02-12 NOTE — Telephone Encounter (Signed)
The quarantine recommendations is minimum of 5-day quarantine, but needs to have 48 hours of no significant symptoms such as cough, fever, sneezing and significant head congestion.   Per his visit he had diarrhea before 5 days prior to December 30 but most of his respiratory symptoms started December 30.  Thus he is roughly on day 6.  To be safe I would recommend him going back on Monday, January 10.  However if he has had no respiratory symptoms such as cough fever or head congestion the last 1 to 2 days I could potentially release him back to work Friday.    From our standpoint he does not need a repeat test as he will have satisfied his quarantine period per the new CDC guidelines the just changed last week  However if his work is mandating a repeat rapid and set him up to come in for this.  We do not agree with this decision to repeat test.

## 2020-02-12 NOTE — Telephone Encounter (Signed)
Pt. Stated he still has head congestion so I wrote him a letter to be out of work until 02/17/20. Letter sent to pt. Via [employer].

## 2020-03-17 ENCOUNTER — Other Ambulatory Visit: Payer: Self-pay

## 2020-03-17 MED ORDER — OMEPRAZOLE 40 MG PO CPDR: 40.0000 mg | DELAYED_RELEASE_CAPSULE | Freq: Every day | ORAL | 1 refills | Status: DC

## 2020-03-17 MED FILL — OMEPRAZOLE 40 MG CPDR: 40 | 30 days supply | Qty: 30 | Fill #0

## 2020-03-26 MED FILL — OMEPRAZOLE 40 MG CPDR: 40 | 30 days supply | Qty: 30 | Fill #0

## 2020-03-30 MED FILL — ALBUTEROL SULFATE HFA 108 (: 108 (90 BAS | 25 days supply | Qty: 18 | Fill #1

## 2020-06-24 ENCOUNTER — Other Ambulatory Visit: Payer: Self-pay

## 2020-06-24 ENCOUNTER — Encounter: Payer: Self-pay | Admitting: Medical

## 2020-06-24 ENCOUNTER — Ambulatory Visit (INDEPENDENT_AMBULATORY_CARE_PROVIDER_SITE_OTHER): Payer: 59 | Admitting: Medical

## 2020-06-24 VITALS — BP 134/98 | HR 79 | Ht 69.0 in | Wt 236.6 lb

## 2020-06-24 DIAGNOSIS — Z1159 Encounter for screening for other viral diseases: Secondary | ICD-10-CM | POA: Diagnosis not present

## 2020-06-24 DIAGNOSIS — Z7185 Encounter for immunization safety counseling: Secondary | ICD-10-CM

## 2020-06-24 DIAGNOSIS — R030 Elevated blood-pressure reading, without diagnosis of hypertension: Secondary | ICD-10-CM | POA: Insufficient documentation

## 2020-06-24 DIAGNOSIS — R635 Abnormal weight gain: Secondary | ICD-10-CM | POA: Diagnosis not present

## 2020-06-24 DIAGNOSIS — Z0000 Encounter for general adult medical examination without abnormal findings: Secondary | ICD-10-CM | POA: Insufficient documentation

## 2020-06-24 HISTORY — PX: NO PAST SURGERIES: SHX2092

## 2020-06-24 NOTE — Patient Instructions (Signed)
Goal BP 120/70 Borderline is 130/80  high blood pressure is 140/90 or higher

## 2020-06-24 NOTE — Progress Notes (Signed)
Subjective:   HPI  Daniel Wood is a 25 y.o. male who presents for Chief Complaint  Patient presents with  . Annual Exam    Physical with fasting labs     Patient Care Team: Jem Castro, Kermit Balo, PA-C as PCP - General (Family Medicine) Sees dentist Sees eye doctor  Concerns: Asthma- last week asthma, has used albuterol a few times this past week.    Married, almost coming up on a year anniversary.    Has gained weight in the past year.  Needs to do better with exercise and diet.   Reviewed their medical, surgical, family, social, medication, and allergy history and updated chart as appropriate.  Past Medical History:  Diagnosis Date  . Allergy   . Asthma     Past Surgical History:  Procedure Laterality Date  . NO PAST SURGERIES  06/24/2020    Family History  Problem Relation Age of Onset  . Asthma Sister   . Diabetes Maternal Grandmother   . Hypertension Maternal Grandmother   . Heart disease Neg Hx   . Stroke Neg Hx   . Cancer Neg Hx      Current Outpatient Medications:  .  albuterol (VENTOLIN HFA) 108 (90 Base) MCG/ACT inhaler, TAKE 2 PUFFS BY MOUTH EVERY 6 HOURS AS NEEDED FOR WHEEZE OR SHORTNESS OF BREATH, Disp: 18 g, Rfl: 1  Allergies  Allergen Reactions  . Shellfish-Derived Products      Review of Systems Constitutional: -fever, -chills, -sweats, -unexpected weight change, -decreased appetite, -fatigue Allergy: -sneezing, -itching, -congestion Dermatology: -changing moles, --rash, -lumps ENT: -runny nose, -ear pain, -sore throat, -hoarseness, -sinus pain, -teeth pain, - ringing in ears, -hearing loss, -nosebleeds Cardiology: -chest pain, -palpitations, -swelling, -difficulty breathing when lying flat, -waking up short of breath Respiratory: -cough, -shortness of breath, -difficulty breathing with exercise or exertion, -wheezing, -coughing up blood Gastroenterology: -abdominal pain, -nausea, -vomiting, -diarrhea, -constipation, -blood in stool,  -changes in bowel movement, -difficulty swallowing or eating Hematology: -bleeding, -bruising  Musculoskeletal: -joint aches, -muscle aches, -joint swelling, -back pain, -neck pain, -cramping, -changes in gait Ophthalmology: denies vision changes, eye redness, itching, discharge Urology: -burning with urination, -difficulty urinating, -blood in urine, -urinary frequency, -urgency, -incontinence Neurology: -headache, -weakness, -tingling, -numbness, -memory loss, -falls, -dizziness Psychology: -depressed mood, -agitation, -sleep problems Male GU: no testicular mass, pain, no lymph nodes swollen, no swelling, no rash.  Depression screen [facility] 2/9 06/24/2020 06/14/2018 03/23/2017  Decreased Interest 0 0 0  Down, Depressed, Hopeless 0 0 0  PHQ - 2 Score 0 0 0        Objective:  BP (!) 134/98   Pulse 79   Ht 5\' 9"  (1.753 m)   Wt 236 lb 9.6 oz (107.3 kg)   SpO2 96%   BMI 34.94 kg/m   General appearance: alert, no distress, WD/WN, African American male Skin: unremarkable, tattoos of left forearm HEENT: normocephalic, conjunctiva/corneas normal, sclerae anicteric, PERRLA, EOMi, nares patent, no discharge or erythema, pharynx normal Neck: supple, no lymphadenopathy, no thyromegaly, no masses, normal ROM, no bruits Chest: non tender, normal shape and expansion Heart: RRR, normal S1, S2, no murmurs Lungs: CTA bilaterally, no wheezes, rhonchi, or rales Abdomen: +bs, soft, non tender, non distended, no masses, no hepatomegaly, no splenomegaly, no bruits Back: non tender, normal ROM, no scoliosis Musculoskeletal: upper extremities non tender, no obvious deformity, normal ROM throughout, lower extremities non tender, no obvious deformity, normal ROM throughout Extremities: no edema, no cyanosis, no clubbing Pulses: 2+ symmetric, upper and  lower extremities, normal cap refill Neurological: alert, oriented x 3, CN2-12 intact, strength normal upper extremities and lower extremities, sensation normal  throughout, DTRs 2+ throughout, no cerebellar signs, gait normal Psychiatric: normal affect, behavior normal, pleasant  GU: normal male external genitalia,circumcised, nontender, no masses, no hernia, no lymphadenopathy Rectal: deferred  EKG indication elevated BP, rate 79 bpm, PR 144 ms, QRS 84 ms, QTC 412 ms, axis 67 degrees normal sinus rhythm.  Nonspecific T wave inversion III    Assessment and Plan :   Encounter Diagnoses  Name Primary?  . Encounter for health maintenance examination in adult Yes  . Weight gain   . Elevated blood-pressure reading without diagnosis of hypertension   . Vaccine counseling   . Encounter for hepatitis C screening test for low risk patient     This visit was a preventative care visit, also known as wellness visit or routine physical.   Topics typically include healthy lifestyle, diet, exercise, preventative care, vaccinations, sick and well care, proper use of emergency dept and after hours care, as well as other concerns.     Recommendations: Continue to return yearly for your annual wellness and preventative care visits.  This gives [place] a chance to discuss healthy lifestyle, exercise, vaccinations, review your chart record, and perform screenings where appropriate.  I recommend you see your eye doctor yearly for routine vision care.  I recommend you see your dentist yearly for routine dental care including hygiene visits twice yearly.   Vaccination recommendations were reviewed Immunization History  Administered Date(s) Administered  . Hepatitis A 07/06/2007, 05/31/2010  . Meningococcal Polysaccharide 07/06/2007  . PFIZER(Purple Top)SARS-COV-2 Vaccination 10/17/2019, 11/07/2019  . Td 07/06/2007, 06/14/2018  . Varicella 05/31/2010    Declines HPV vaccine   Screening for cancer: Colon cancer screening: Age 10  Testicular cancer screening You should do a monthly self testicular exam if you are between 12-58 years old  We discussed PSA,  prostate exam, and prostate cancer screening risks/benefits.   Age 71  Skin cancer screening: Check your skin regularly for new changes, growing lesions, or other lesions of concern Come in for evaluation if you have skin lesions of concern.  Lung cancer screening: If you have a greater than 20 pack year history of tobacco use, then you may qualify for lung cancer screening with a chest CT scan.   Please call your insurance company to inquire about coverage for this test.  We currently don't have screenings for other cancers besides breast, cervical, colon, and lung cancers.  If you have a strong family history of cancer or have other cancer screening concerns, please let me know.    Bone health: Get at least 150 minutes of aerobic exercise weekly Get weight bearing exercise at least once weekly Bone density test:   A bone density test is an imaging test that uses a type of X-ray to measure the amount of calcium and other minerals in your bones.  The test may be used to diagnose or screen you for a condition that causes weak or thin bones (osteoporosis), predict your risk for a broken bone (fracture), or determine how well your osteoporosis treatment is working. The bone density test is recommended for females 65 and older, or females or males <65 if certain risk factors such as thyroid disease, long term use of steroids such as for asthma or rheumatological issues, vitamin D deficiency, estrogen deficiency, family history of osteoporosis, self or family history of fragility fracture in first degree relative.  Heart health: Get at least 150 minutes of aerobic exercise weekly Limit alcohol It is important to maintain a healthy blood pressure and healthy cholesterol numbers  Heart disease screening: Screening for heart disease includes screening for blood pressure, fasting lipids, glucose/diabetes screening, BMI height to weight ratio, reviewed of smoking status, physical activity, and  diet.    Goals include blood pressure 120/80 or less, maintaining a healthy lipid/cholesterol profile, preventing diabetes or keeping diabetes numbers under good control, not smoking or using tobacco products, exercising most days per week or at least 150 minutes per week of exercise, and eating healthy variety of fruits and vegetables, healthy oils, and avoiding unhealthy food choices like fried food, fast food, high sugar and high cholesterol foods.    Other tests may possibly include EKG test, CT coronary calcium score, echocardiogram, exercise treadmill stress test.    Medical care options: I recommend you continue to seek care here first for routine care.  We try really hard to have available appointments Monday through Friday daytime hours for sick visits, acute visits, and physicals.  Urgent care should be used for after hours and weekends for significant issues that cannot wait till the next day.  The emergency department should be used for significant potentially life-threatening emergencies.  The emergency department is expensive, can often have long wait times for less significant concerns, so try to utilize primary care, urgent care, or telemedicine when possible to avoid unnecessary trips to the emergency department.  Virtual visits and telemedicine have been introduced since the pandemic started in 2020, and can be convenient ways to receive medical care.  We offer virtual appointments as well to assist you in a variety of options to seek medical care.    Separate significant issues discussed: Elevated BP - counseled on need to make lifestyle changes to lose weight, limit salt, cut back on animal products in diet.  Advised he check BPs at home or pharmacy over the next 3-4 weeks then follow up  Asthma - c/t albuterol prn, recheck if worsening symptoms     Jonovan was seen today for annual exam.  Diagnoses and all orders for this visit:  Encounter for health maintenance  examination in adult -     EKG 12-Lead -     Comprehensive metabolic panel -     CBC -     TSH -     Hepatitis C antibody  Weight gain  Elevated blood-pressure reading without diagnosis of hypertension -     EKG 12-Lead  Vaccine counseling  Encounter for hepatitis C screening test for low risk patient -     Hepatitis C antibody   Follow-up pending labs, yearly for physical

## 2020-06-25 LAB — CBC
Hematocrit: 46.6 % (ref 37.5–51.0)
Hemoglobin: 15.8 g/dL (ref 13.0–17.7)
MCH: 29.4 pg (ref 26.6–33.0)
MCHC: 33.9 g/dL (ref 31.5–35.7)
MCV: 87 fL (ref 79–97)
Platelets: 242 10*3/uL (ref 150–450)
RBC: 5.38 x10E6/uL (ref 4.14–5.80)
RDW: 12.9 % (ref 11.6–15.4)
WBC: 10.5 10*3/uL (ref 3.4–10.8)

## 2020-06-25 LAB — COMPREHENSIVE METABOLIC PANEL
ALT: 35 IU/L (ref 0–44)
AST: 20 IU/L (ref 0–40)
Albumin/Globulin Ratio: 1.8 (ref 1.2–2.2)
Albumin: 4.9 g/dL (ref 4.1–5.2)
Alkaline Phosphatase: 105 IU/L (ref 44–121)
BUN/Creatinine Ratio: 12 (ref 9–20)
BUN: 11 mg/dL (ref 6–20)
Bilirubin Total: <0.2 mg/dL (ref 0.0–1.2)
CO2: 20 mmol/L (ref 20–29)
Calcium: 10 mg/dL (ref 8.7–10.2)
Chloride: 101 mmol/L (ref 96–106)
Creatinine, Ser: 0.92 mg/dL (ref 0.76–1.27)
Globulin, Total: 2.7 g/dL (ref 1.5–4.5)
Glucose: 113 mg/dL — ABNORMAL HIGH (ref 65–99)
Potassium: 4.1 mmol/L (ref 3.5–5.2)
Sodium: 139 mmol/L (ref 134–144)
Total Protein: 7.6 g/dL (ref 6.0–8.5)
eGFR: 119 mL/min/{1.73_m2} (ref >59)

## 2020-06-25 LAB — HEPATITIS C ANTIBODY: Hep C Virus Ab: 0.2 s/co ratio (ref 0.0–0.9)

## 2020-06-25 LAB — TSH: TSH: 1.67 u[IU]/mL (ref 0.450–4.500)

## 2020-09-22 ENCOUNTER — Other Ambulatory Visit: Payer: Self-pay

## 2020-09-22 ENCOUNTER — Ambulatory Visit: Payer: 59 | Admitting: Medical

## 2020-09-22 VITALS — BP 120/70 | HR 84 | Temp 98.4°F | Wt 237.2 lb

## 2020-09-22 DIAGNOSIS — M791 Myalgia, unspecified site: Secondary | ICD-10-CM

## 2020-09-22 DIAGNOSIS — R252 Cramp and spasm: Secondary | ICD-10-CM | POA: Diagnosis not present

## 2020-09-22 NOTE — Progress Notes (Signed)
Subjective:  Daniel Wood is a 25 y.o. male who presents for Chief Complaint  Patient presents with   muscle cramps    Muscles cramps everywhere usually at night- thighs, ribs, side, legs     Here for c/o muscle cramps intermittent for the past year.  Works on Friday - Sunday.   Typical cramps come Saturday and linger through weekends.   Drinks a lot of water, urine is clear.   Eats variety of fruits, smoothies, vegetables, grains, meat.   Can wake up in the middle of the night with cramps, takes 20 minutes to ease up sometimes.   Feels a little knot in back of left neck.   No fever, no chills, no weight changes, no recent illness.  Asthma - has Flovent and albuterol.   Uses albuterol on average once per week.  Working at [employer].   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  Lifts boxes all day at work.  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    Outside of work not currently doing exercise or sports.  No other aggravating or relieving factors.    No other c/o.  Past Medical History:  Diagnosis Date   Allergy    Asthma    Current Outpatient Medications on File Prior to Visit  Medication Sig Dispense Refill   albuterol (VENTOLIN HFA) 108 (90 Base) MCG/ACT inhaler TAKE 2 PUFFS BY MOUTH EVERY 6 HOURS AS NEEDED FOR WHEEZE OR SHORTNESS OF BREATH 18 g 1   No current facility-administered medications on file prior to visit.     The following portions of the patient's history were reviewed and updated as appropriate: allergies, current medications, past family history, past medical history, past social history, past surgical history and problem list.  ROS Otherwise as in subjective above  Objective: BP 120/70   Pulse 84   Temp 98.4 F (36.9 C)   Wt 237 lb 3.2 oz (107.6 kg)   BMI 35.03 kg/m   General appearance: alert, no distress, well developed, well nourished, African-American male Neck: supple, no lymphadenopathy, no thyromegaly, no  masses Heart: RRR, normal S1, S2, no murmurs Lungs: CTA bilaterally, no wheezes, rhonchi, or rales MSK: Arms and legs nontender no swelling no obvious deformity, normal range of motion upper and lower extremities Neuro: CN II through XII intact, nonfocal exam, normal strength DTRs and sensation Pulses: 2+ radial pulses, 2+ pedal pulses, normal cap refill Ext: no edema   Assessment: Encounter Diagnoses  Name Primary?   Muscle cramp Yes   Myalgia      Plan: Muscle cramps and myalgias-I suspect this is a water and nutrient issue.  On the weekends he works 12-hour days physically active all day long.  Some days he does not eat at all while at work.  I advised that he add in some nutrients throughout the day and he may actually need much more water than he is drinking on those particular days.  Nevertheless we will go ahead and check some labs below just to make sure no acute issue that needs to be addressed  Reassured that his left neck knot is a small lymph node that does not appear worrisome and he has no worrisome symptoms related to this  Daniel Wood was seen today for muscle cramps.  Diagnoses and all orders for this visit:  Muscle cramp -     Magnesium -     CK -     Sedimentation rate -  Basic metabolic panel  Myalgia -     Magnesium -     CK -     Sedimentation rate -     Basic metabolic panel   Follow up: Pending labs

## 2020-09-23 DIAGNOSIS — E639 Nutritional deficiency, unspecified: Secondary | ICD-10-CM

## 2020-09-23 DIAGNOSIS — R635 Abnormal weight gain: Secondary | ICD-10-CM

## 2020-09-23 LAB — BASIC METABOLIC PANEL
BUN/Creatinine Ratio: 10 (ref 9–20)
BUN: 8 mg/dL (ref 6–20)
CO2: 21 mmol/L (ref 20–29)
Calcium: 9.3 mg/dL (ref 8.7–10.2)
Chloride: 103 mmol/L (ref 96–106)
Creatinine, Ser: 0.8 mg/dL (ref 0.76–1.27)
Glucose: 107 mg/dL — ABNORMAL HIGH (ref 65–99)
Potassium: 4.4 mmol/L (ref 3.5–5.2)
Sodium: 140 mmol/L (ref 134–144)
eGFR: 127 mL/min/{1.73_m2} (ref >59)

## 2020-09-23 LAB — SEDIMENTATION RATE: Sed Rate: 19 mm/hr — ABNORMAL HIGH (ref 0–15)

## 2020-09-23 LAB — MAGNESIUM: Magnesium: 2.1 mg/dL (ref 1.6–2.3)

## 2020-09-23 LAB — CK: Total CK: 274 U/L (ref 49–439)

## 2020-10-16 ENCOUNTER — Other Ambulatory Visit (HOSPITAL_COMMUNITY): Payer: Self-pay

## 2020-10-16 ENCOUNTER — Other Ambulatory Visit: Payer: Self-pay | Admitting: Medical

## 2020-10-16 DIAGNOSIS — J4540 Moderate persistent asthma, uncomplicated: Secondary | ICD-10-CM

## 2020-10-16 MED ORDER — BECLOMETHASONE DIPROP HFA 80 MCG/ACT IN AERB
INHALATION_SPRAY | RESPIRATORY_TRACT | 5 refills | Status: DC
  Filled 2020-10-16: qty 10.6, 30d supply, fill #0

## 2020-10-16 MED ORDER — FLUTICASONE PROPIONATE HFA 44 MCG/ACT IN AERO: 2.0000 | INHALATION_SPRAY | Freq: Two times a day (BID) | RESPIRATORY_TRACT | 0 refills | Status: DC

## 2020-10-16 MED ORDER — QVAR REDIHALER 80 MCG/ACT IN AERB: 2.0000 | INHALATION_SPRAY | Freq: Two times a day (BID) | RESPIRATORY_TRACT | 5 refills | Status: DC

## 2020-10-16 MED ORDER — ALBUTEROL SULFATE HFA 108 (90 BASE) MCG/ACT IN AERS
INHALATION_SPRAY | RESPIRATORY_TRACT | 1 refills | Status: DC
Start: 2020-10-16 — End: 2020-10-19
  Filled 2020-10-16: qty 18, 25d supply, fill #0

## 2020-10-16 NOTE — Telephone Encounter (Signed)
Pt called for refills of albuterol and Qvar. Pt can be reached at [phone].

## 2020-10-16 NOTE — Telephone Encounter (Signed)
Insurance is requiring medication alternate instead of qvar, thus flovent sent.   If not getting the response desired in a month, we can try again for qvar

## 2020-10-17 ENCOUNTER — Emergency Department (HOSPITAL_BASED_OUTPATIENT_CLINIC_OR_DEPARTMENT_OTHER): Admission: EM | Admit: 2020-10-17 | Discharge status: Against Medical Advice | Payer: 59 | Source: Home / Self Care

## 2020-10-17 ENCOUNTER — Other Ambulatory Visit: Payer: Self-pay

## 2020-10-19 ENCOUNTER — Ambulatory Visit: Payer: 59 | Admitting: Family Medicine

## 2020-10-19 ENCOUNTER — Other Ambulatory Visit (HOSPITAL_BASED_OUTPATIENT_CLINIC_OR_DEPARTMENT_OTHER): Payer: Self-pay

## 2020-10-19 ENCOUNTER — Other Ambulatory Visit: Payer: Self-pay

## 2020-10-19 VITALS — BP 148/98 | HR 96 | Temp 98.2°F | Wt 234.4 lb

## 2020-10-19 DIAGNOSIS — H6692 Otitis media, unspecified, left ear: Secondary | ICD-10-CM

## 2020-10-19 DIAGNOSIS — J4540 Moderate persistent asthma, uncomplicated: Secondary | ICD-10-CM | POA: Diagnosis not present

## 2020-10-19 DIAGNOSIS — J4521 Mild intermittent asthma with (acute) exacerbation: Secondary | ICD-10-CM

## 2020-10-19 MED ORDER — QVAR REDIHALER 80 MCG/ACT IN AERB
2.0000 | INHALATION_SPRAY | Freq: Two times a day (BID) | RESPIRATORY_TRACT | 5 refills | Status: DC
Start: 2020-10-19 — End: 2021-04-15
  Filled 2020-10-19 (×3): qty 10.6, 30d supply, fill #0

## 2020-10-19 MED ORDER — AMOXICILLIN 875 MG PO TABS
875.0000 mg | ORAL_TABLET | Freq: Two times a day (BID) | ORAL | 0 refills | Status: DC
  Filled 2020-10-19 – 2020-10-21 (×2): qty 20, 10d supply, fill #0

## 2020-10-19 MED ORDER — ALBUTEROL SULFATE HFA 108 (90 BASE) MCG/ACT IN AERS
INHALATION_SPRAY | RESPIRATORY_TRACT | 1 refills | Status: DC
  Filled 2020-10-19: qty 18, 25d supply, fill #0
  Filled 2020-10-30: qty 18, 30d supply, fill #0
  Filled 2021-03-04: qty 18, 25d supply, fill #1

## 2020-10-19 NOTE — Progress Notes (Signed)
   Subjective:    Patient ID: Daniel Wood, male    DOB: 1995/12/12, 25 y.o.   MRN: [id number]  HPI He states that on Tuesday he developed nasal congestion, slight sore throat, headache and coughing but no fever or chills.  After that he did check for COVID twice and was negative.  He states that he is feeling better but not over this entirely him still having a slight cough as well as some shortness of breath.  He has been using his rescue inhaler much more often than normal.  He also has seasonal asthma and in the past has used Qvar with good results.   Review of Systems     Objective:   Physical Exam Alert and in no distress. Tympanic membrane on the left is dull and red, right is normal canals are normal. Pharyngeal area is normal. Neck is supple without adenopathy or thyromegaly. Cardiac exam shows a regular sinus rhythm without murmurs or gallops. Lungs are clear to auscultation, breath sounds are harsh.        Assessment & Plan:  Mild intermittent asthmatic bronchitis with acute exacerbation - Plan: amoxicillin (AMOXIL) 875 MG tablet  Moderate persistent extrinsic asthma without complication - Plan: beclomethasone (QVAR REDIHALER) 80 MCG/ACT inhaler, albuterol (VENTOLIN HFA) 108 (90 Base) MCG/ACT inhaler  Left otitis media, unspecified otitis media type - Plan: amoxicillin (AMOXIL) 875 MG tablet He does have left otitis as well as a probable bacterial bronchitis.  I will add Qvar back to his regimen since that seemed to help with his breathing.  He will continue to use the albuterol and hopefully will diminish the need for that.  He was

## 2020-10-20 ENCOUNTER — Other Ambulatory Visit (HOSPITAL_BASED_OUTPATIENT_CLINIC_OR_DEPARTMENT_OTHER): Payer: Self-pay

## 2020-10-20 NOTE — Telephone Encounter (Signed)
Called pt and informed

## 2020-10-21 ENCOUNTER — Telehealth: Payer: Self-pay | Admitting: Medical

## 2020-10-21 ENCOUNTER — Other Ambulatory Visit (HOSPITAL_COMMUNITY): Payer: Self-pay

## 2020-10-21 ENCOUNTER — Other Ambulatory Visit (HOSPITAL_BASED_OUTPATIENT_CLINIC_OR_DEPARTMENT_OTHER): Payer: Self-pay

## 2020-10-21 NOTE — Telephone Encounter (Signed)
Pt called and stated that he was sent in Qvar and was supposed to have flovent sent in to [facility] pharmacy because the Qvar is not covered by his insurance

## 2020-10-22 ENCOUNTER — Other Ambulatory Visit (HOSPITAL_COMMUNITY): Payer: Self-pay

## 2020-10-23 ENCOUNTER — Other Ambulatory Visit (HOSPITAL_COMMUNITY): Payer: Self-pay

## 2020-10-23 ENCOUNTER — Other Ambulatory Visit (HOSPITAL_BASED_OUTPATIENT_CLINIC_OR_DEPARTMENT_OTHER): Payer: Self-pay

## 2020-10-23 MED ORDER — FLUTICASONE PROPIONATE HFA 44 MCG/ACT IN AERO
INHALATION_SPRAY | RESPIRATORY_TRACT | 5 refills | Status: DC
  Filled 2020-10-23 – 2021-03-04 (×3): qty 10.6, 30d supply, fill #0

## 2020-10-23 NOTE — Telephone Encounter (Signed)
Pt is on Flovent, he was switched due to Qvar is not covered by insurance.  Called and cancelled the Qvar and refilled the Flovent.  Called pt and informed, he states Flovent is working well and he wants all refills to go to [facility].

## 2020-10-30 ENCOUNTER — Other Ambulatory Visit (HOSPITAL_COMMUNITY): Payer: Self-pay

## 2020-11-05 ENCOUNTER — Other Ambulatory Visit (HOSPITAL_COMMUNITY): Payer: Self-pay

## 2020-11-16 ENCOUNTER — Encounter: Payer: Self-pay | Admitting: Internal Medicine

## 2021-01-13 DIAGNOSIS — M9905 Segmental and somatic dysfunction of pelvic region: Secondary | ICD-10-CM | POA: Diagnosis not present

## 2021-01-13 DIAGNOSIS — M9901 Segmental and somatic dysfunction of cervical region: Secondary | ICD-10-CM | POA: Diagnosis not present

## 2021-01-13 DIAGNOSIS — M5451 Vertebrogenic low back pain: Secondary | ICD-10-CM | POA: Diagnosis not present

## 2021-01-13 DIAGNOSIS — M9903 Segmental and somatic dysfunction of lumbar region: Secondary | ICD-10-CM | POA: Diagnosis not present

## 2021-01-14 DIAGNOSIS — M9901 Segmental and somatic dysfunction of cervical region: Secondary | ICD-10-CM | POA: Diagnosis not present

## 2021-01-14 DIAGNOSIS — M9903 Segmental and somatic dysfunction of lumbar region: Secondary | ICD-10-CM | POA: Diagnosis not present

## 2021-01-14 DIAGNOSIS — M9905 Segmental and somatic dysfunction of pelvic region: Secondary | ICD-10-CM | POA: Diagnosis not present

## 2021-01-14 DIAGNOSIS — M5451 Vertebrogenic low back pain: Secondary | ICD-10-CM | POA: Diagnosis not present

## 2021-01-19 DIAGNOSIS — M9901 Segmental and somatic dysfunction of cervical region: Secondary | ICD-10-CM | POA: Diagnosis not present

## 2021-01-19 DIAGNOSIS — M5451 Vertebrogenic low back pain: Secondary | ICD-10-CM | POA: Diagnosis not present

## 2021-01-19 DIAGNOSIS — M9905 Segmental and somatic dysfunction of pelvic region: Secondary | ICD-10-CM | POA: Diagnosis not present

## 2021-01-19 DIAGNOSIS — M9903 Segmental and somatic dysfunction of lumbar region: Secondary | ICD-10-CM | POA: Diagnosis not present

## 2021-01-20 DIAGNOSIS — M9905 Segmental and somatic dysfunction of pelvic region: Secondary | ICD-10-CM | POA: Diagnosis not present

## 2021-01-20 DIAGNOSIS — M5451 Vertebrogenic low back pain: Secondary | ICD-10-CM | POA: Diagnosis not present

## 2021-01-20 DIAGNOSIS — M9901 Segmental and somatic dysfunction of cervical region: Secondary | ICD-10-CM | POA: Diagnosis not present

## 2021-01-20 DIAGNOSIS — M9903 Segmental and somatic dysfunction of lumbar region: Secondary | ICD-10-CM | POA: Diagnosis not present

## 2021-01-25 DIAGNOSIS — M9901 Segmental and somatic dysfunction of cervical region: Secondary | ICD-10-CM | POA: Diagnosis not present

## 2021-01-25 DIAGNOSIS — M5451 Vertebrogenic low back pain: Secondary | ICD-10-CM | POA: Diagnosis not present

## 2021-01-25 DIAGNOSIS — M9903 Segmental and somatic dysfunction of lumbar region: Secondary | ICD-10-CM | POA: Diagnosis not present

## 2021-01-25 DIAGNOSIS — M9905 Segmental and somatic dysfunction of pelvic region: Secondary | ICD-10-CM | POA: Diagnosis not present

## 2021-01-28 DIAGNOSIS — M9905 Segmental and somatic dysfunction of pelvic region: Secondary | ICD-10-CM | POA: Diagnosis not present

## 2021-01-28 DIAGNOSIS — M5451 Vertebrogenic low back pain: Secondary | ICD-10-CM | POA: Diagnosis not present

## 2021-01-28 DIAGNOSIS — M9901 Segmental and somatic dysfunction of cervical region: Secondary | ICD-10-CM | POA: Diagnosis not present

## 2021-01-28 DIAGNOSIS — M9903 Segmental and somatic dysfunction of lumbar region: Secondary | ICD-10-CM | POA: Diagnosis not present

## 2021-02-02 DIAGNOSIS — M9905 Segmental and somatic dysfunction of pelvic region: Secondary | ICD-10-CM | POA: Diagnosis not present

## 2021-02-02 DIAGNOSIS — M5451 Vertebrogenic low back pain: Secondary | ICD-10-CM | POA: Diagnosis not present

## 2021-02-02 DIAGNOSIS — M9903 Segmental and somatic dysfunction of lumbar region: Secondary | ICD-10-CM | POA: Diagnosis not present

## 2021-02-02 DIAGNOSIS — M9901 Segmental and somatic dysfunction of cervical region: Secondary | ICD-10-CM | POA: Diagnosis not present

## 2021-02-03 DIAGNOSIS — M9901 Segmental and somatic dysfunction of cervical region: Secondary | ICD-10-CM | POA: Diagnosis not present

## 2021-02-03 DIAGNOSIS — M9903 Segmental and somatic dysfunction of lumbar region: Secondary | ICD-10-CM | POA: Diagnosis not present

## 2021-02-03 DIAGNOSIS — M5451 Vertebrogenic low back pain: Secondary | ICD-10-CM | POA: Diagnosis not present

## 2021-02-03 DIAGNOSIS — M9905 Segmental and somatic dysfunction of pelvic region: Secondary | ICD-10-CM | POA: Diagnosis not present

## 2021-03-04 ENCOUNTER — Other Ambulatory Visit (HOSPITAL_COMMUNITY): Payer: Self-pay

## 2021-03-05 ENCOUNTER — Other Ambulatory Visit (HOSPITAL_COMMUNITY): Payer: Self-pay

## 2021-04-15 ENCOUNTER — Other Ambulatory Visit: Payer: Self-pay

## 2021-04-15 ENCOUNTER — Telehealth: Payer: 59 | Admitting: Medical

## 2021-04-15 ENCOUNTER — Other Ambulatory Visit (HOSPITAL_COMMUNITY): Payer: Self-pay

## 2021-04-15 ENCOUNTER — Encounter: Payer: Self-pay | Admitting: Medical

## 2021-04-15 VITALS — BP 163/113 | HR 86 | Temp 99.0°F | Wt 230.0 lb

## 2021-04-15 DIAGNOSIS — J019 Acute sinusitis, unspecified: Secondary | ICD-10-CM | POA: Diagnosis not present

## 2021-04-15 DIAGNOSIS — R030 Elevated blood-pressure reading, without diagnosis of hypertension: Secondary | ICD-10-CM | POA: Diagnosis not present

## 2021-04-15 MED ORDER — AMOXICILLIN-POT CLAVULANATE 875-125 MG PO TABS
1.0000 | ORAL_TABLET | Freq: Two times a day (BID) | ORAL | 0 refills | Status: DC
  Filled 2021-04-15: qty 20, 10d supply, fill #0

## 2021-04-15 NOTE — Progress Notes (Signed)
?Subjective:  ?  ? Patient ID: Daniel Wood, male   DOB: 03-19-1995, 26 y.o.   MRN: [id number] ? ?This visit type was conducted due to national recommendations for restrictions regarding the COVID-19 Pandemic (e.g. social distancing) in an effort to limit this patient's exposure and mitigate transmission in our community.  Due to their co-morbid illnesses, this patient is at least at moderate risk for complications without adequate follow up.  This format is felt to be most appropriate for this patient at this time.   ? ?Documentation for virtual audio and video telecommunications through [place] encounter: ? ?The patient was located at home. ?The provider was located in the office. ?The patient did consent to this visit and is aware of possible charges through their insurance for this visit. ? ?The other persons participating in this telemedicine service were none. ?Time spent on call was 20 minutes and in review of previous records 20 minutes total. ? ?This virtual service is not related to other E/M service within previous 7 days. ? ? ?HPI ?Chief Complaint  ?Patient presents with  ? sinus infection  ?  Sinus infection- swallowing sometimes will have ear pain, congestion, drainage, cough, HA, x 4 weeks ago. Taking mucinex . BP is staying elevated   ? ?Virtual consult.  Wife got sick recently and he got sick a few days after her.   He started getting symptoms about 4 weeks ago.   Been having congestion, ear pressure, cough, headache, phlegm.   Sometimes coughing up phlegm.   Last week had a change in symptoms with abrupt onset of fever, aches.   Still has sore throat.   Maybe had aches and chills last week but those symptoms improved.  Getting yellow and green mucous.  Used tylenol fast relief on and off for fever.   Did recent covid test that was negative.   Today feeling a little better, but not back to normal.  Wife only has minimal symptoms left.   ?No sneezing.   ? ?Wife starting early labor  today. ? ?Also concerned about blood pressure.  Checking BPs some, not regularly.   He notes a month ago went to dentist, and BP was 178/110.  This morning 168/113.  No chest pain, palpitations, edema, blurred vision ? ?No other aggravating or relieving factors. No other complaint. ? ? ? ?Past Medical History:  ?Diagnosis Date  ? Allergy   ? Asthma   ? ?Current Outpatient Medications on File Prior to Visit  ?Medication Sig Dispense Refill  ? albuterol (VENTOLIN HFA) 108 (90 Base) MCG/ACT inhaler INHALE 2 PUFFS BY MOUTH EVERY 6 HOURS AS NEEDED FOR WHEEZE OR SHORTNESS OF BREATH 18 g 1  ? fluticasone (FLOVENT HFA) 44 MCG/ACT inhaler Inhale 2 puffs into the lungs twice daily. 10.6 g 5  ? ?No current facility-administered medications on file prior to visit.  ? ?Family History  ?Problem Relation Age of Onset  ? Asthma Sister   ? Diabetes Maternal Grandmother   ? Hypertension Maternal Grandmother   ? Heart disease Neg Hx   ? Stroke Neg Hx   ? Cancer Neg Hx   ? ? ? ?Review of Systems ?As in subjective ?   ?Objective:  ? Physical Exam ?Due to coronavirus pandemic stay at home measures, patient visit was virtual and they were not examined in person.   ? ?BP (!) 163/113   Pulse 86   Temp 99 ?F (37.2 ?C)   Wt 230 lb (104.3 kg)  BMI 33.97 kg/m?  ? ?Wt Readings from Last 3 Encounters:  ?04/15/21 230 lb (104.3 kg)  ?10/19/20 234 lb 6.4 oz (106.3 kg)  ?09/22/20 237 lb 3.2 oz (107.6 kg)  ? ?BP Readings from Last 3 Encounters:  ?04/15/21 (!) 163/113  ?10/19/20 (!) 148/98  ?09/22/20 120/70  ? ?Gen: wd, wn, nad, congested sounding ?No labored breathing or wheezing ? ? ? ?   ?Assessment:  ?   ?Encounter Diagnoses  ?Name Primary?  ? Acute non-recurrent sinusitis, unspecified location Yes  ? Elevated blood-pressure reading without diagnosis of hypertension   ? ? ?   ?Plan:  ?   ?Sinusitis-begin Augmentin, Mucinex DM, nasal saline flush, good hydration.  If not resolved within the next 5 to 7 days then call back or recheck ? ?Elevated  blood pressures-discussed risk of uncontrolled high blood pressure.  I asked him to come in 1 day in a week or so and bring his cuff with him to compare blood pressures in the office.  His blood pressure was normal back in August.  He had an elevated reading in September.  His readings currently are higher at home.  We discussed the need for weight loss, limiting salt, exercising and eating a healthy low-fat low sugar diet.  Avoid sinus decongestant ? ? ?Daniel Wood was seen today for sinus infection. ? ?Diagnoses and all orders for this visit: ? ?Acute non-recurrent sinusitis, unspecified location ? ?Elevated blood-pressure reading without diagnosis of hypertension ? ?Other orders ?-     amoxicillin-clavulanate (AUGMENTIN) 875-125 MG tablet; Take 1 tablet by mouth 2 (two) times daily. ? ?F/u 1 week nurse visit BP check ? ? ?

## 2021-06-29 ENCOUNTER — Other Ambulatory Visit (HOSPITAL_COMMUNITY): Payer: Self-pay

## 2021-06-29 ENCOUNTER — Ambulatory Visit (INDEPENDENT_AMBULATORY_CARE_PROVIDER_SITE_OTHER): Payer: 59 | Admitting: Medical

## 2021-06-29 VITALS — BP 146/110 | HR 80 | Ht 70.0 in | Wt 250.6 lb

## 2021-06-29 DIAGNOSIS — J301 Allergic rhinitis due to pollen: Secondary | ICD-10-CM | POA: Diagnosis not present

## 2021-06-29 DIAGNOSIS — R635 Abnormal weight gain: Secondary | ICD-10-CM | POA: Diagnosis not present

## 2021-06-29 DIAGNOSIS — Z0000 Encounter for general adult medical examination without abnormal findings: Secondary | ICD-10-CM

## 2021-06-29 DIAGNOSIS — M62838 Other muscle spasm: Secondary | ICD-10-CM | POA: Insufficient documentation

## 2021-06-29 DIAGNOSIS — J4540 Moderate persistent asthma, uncomplicated: Secondary | ICD-10-CM | POA: Diagnosis not present

## 2021-06-29 DIAGNOSIS — K30 Functional dyspepsia: Secondary | ICD-10-CM | POA: Diagnosis not present

## 2021-06-29 DIAGNOSIS — R7301 Impaired fasting glucose: Secondary | ICD-10-CM

## 2021-06-29 DIAGNOSIS — I10 Essential (primary) hypertension: Secondary | ICD-10-CM | POA: Diagnosis not present

## 2021-06-29 DIAGNOSIS — K219 Gastro-esophageal reflux disease without esophagitis: Secondary | ICD-10-CM

## 2021-06-29 DIAGNOSIS — R5383 Other fatigue: Secondary | ICD-10-CM

## 2021-06-29 DIAGNOSIS — Z13220 Encounter for screening for lipoid disorders: Secondary | ICD-10-CM

## 2021-06-29 LAB — POCT URINALYSIS DIP (PROADVANTAGE DEVICE)
Bilirubin, UA: NEGATIVE
Blood, UA: NEGATIVE
Glucose, UA: NEGATIVE mg/dL
Ketones, POC UA: NEGATIVE mg/dL
Leukocytes, UA: NEGATIVE
Nitrite, UA: NEGATIVE
Protein Ur, POC: NEGATIVE mg/dL
Specific Gravity, Urine: 1.025
Urobilinogen, Ur: NEGATIVE
pH, UA: 6 (ref 5.0–8.0)

## 2021-06-29 MED ORDER — FAMOTIDINE 40 MG PO TABS
40.0000 mg | ORAL_TABLET | Freq: Every day | ORAL | 2 refills | Status: DC
  Filled 2021-06-29: qty 30, 30d supply, fill #0
  Filled 2021-08-06: qty 30, 30d supply, fill #1
  Filled 2021-09-07: qty 30, 30d supply, fill #2

## 2021-06-29 MED ORDER — VALSARTAN-HYDROCHLOROTHIAZIDE 80-12.5 MG PO TABS
1.0000 | ORAL_TABLET | Freq: Every day | ORAL | 3 refills | Status: DC
  Filled 2021-06-29: qty 90, 90d supply, fill #0
  Filled 2021-10-04: qty 90, 90d supply, fill #1

## 2021-06-29 MED ORDER — TIZANIDINE HCL 4 MG PO TABS
4.0000 mg | ORAL_TABLET | Freq: Every day | ORAL | 0 refills | Status: DC
  Filled 2021-06-29: qty 30, 30d supply, fill #0

## 2021-06-29 NOTE — Assessment & Plan Note (Signed)
Work on good hydration, stretching, can use muscle laxer as needed

## 2021-06-29 NOTE — Assessment & Plan Note (Signed)
Work on efforts to lose weight through healthy diet and exercise ?

## 2021-06-29 NOTE — Assessment & Plan Note (Signed)
Avoid reflux triggers, work on losing weight, begin medication as below, famotidine

## 2021-06-29 NOTE — Progress Notes (Signed)
Subjective:   HPI  Daniel Wood is a 26 y.o. male who presents for Chief Complaint  Patient presents with   Annual Exam    Fasting annual exam. Having some issues with reflux but he thinks it is due to his diet. Thinks he may need a muscle relaxant-due to muscle cramping in the legs. Also having some elevated blood pressures. No other concerns.     Patient Care Team: Shana Zavaleta, Leward Quan as PCP - General (Family Medicine) Sees dentist Sees eye doctor  Concerns: He has several concerns.  Lately been having some acid reflux.  He gets muscle spasms.  He does get achy and does some physical labor job but some nights his leg spasm is upper back spasms.  He would like a muscle laxer to have on hand.  He recently had a family member die of a heart condition.  I recommended he have several test done.  He has this on his phone as a list.  He has been checking blood pressures at home lately and he is seeing high numbers lately in general.  He denies chest pain, palpitations, edema.  He says that life has been challenging in the last year.  He has a new baby now.  Him and his wife are trying to get back to an exercise routine.  He has gained weight.   Reviewed their medical, surgical, family, social, medication, and allergy history and updated chart as appropriate.  Past Medical History:  Diagnosis Date   Allergy    Asthma     Past Surgical History:  Procedure Laterality Date   NO PAST SURGERIES  06/24/2020    Family History  Problem Relation Age of Onset   Asthma Sister    Diabetes Maternal Grandmother    Hypertension Maternal Grandmother    Heart disease Neg Hx    Stroke Neg Hx    Cancer Neg Hx      Current Outpatient Medications:    famotidine (PEPCID) 40 MG tablet, Take 1 tablet (40 mg total) by mouth daily., Disp: 30 tablet, Rfl: 2   tiZANidine (ZANAFLEX) 4 MG tablet, Take 1 tablet (4 mg total) by mouth at bedtime., Disp: 30 tablet, Rfl: 0    valsartan-hydrochlorothiazide (DIOVAN-HCT) 80-12.5 MG tablet, Take 1 tablet by mouth daily., Disp: 90 tablet, Rfl: 3   albuterol (VENTOLIN HFA) 108 (90 Base) MCG/ACT inhaler, INHALE 2 PUFFS BY MOUTH EVERY 6 HOURS AS NEEDED FOR WHEEZE OR SHORTNESS OF BREATH (Patient not taking: Reported on 06/29/2021), Disp: 18 g, Rfl: 1   cetirizine (ZYRTEC) 10 MG tablet, Take 10 mg by mouth daily. (Patient not taking: Reported on 06/29/2021), Disp: , Rfl:    fluticasone (FLOVENT HFA) 44 MCG/ACT inhaler, Inhale 2 puffs into the lungs twice daily. (Patient not taking: Reported on 06/29/2021), Disp: 10.6 g, Rfl: 5  Allergies  Allergen Reactions   Shellfish-Derived Products      Review of Systems  Constitutional:  Positive for malaise/fatigue. Negative for chills, fever and weight loss.  HENT:  Negative for congestion, ear pain, hearing loss, sore throat and tinnitus.   Eyes:  Negative for blurred vision, pain and redness.  Respiratory:  Positive for wheezing. Negative for cough, hemoptysis and shortness of breath.   Cardiovascular:  Negative for chest pain, palpitations, orthopnea, claudication and leg swelling.  Gastrointestinal:  Positive for heartburn. Negative for abdominal pain, blood in stool, constipation, diarrhea, nausea and vomiting.  Genitourinary:  Negative for dysuria, flank pain, frequency, hematuria and urgency.  Musculoskeletal:  Positive for myalgias. Negative for falls and joint pain.  Skin:  Negative for itching and rash.  Neurological:  Negative for dizziness, tingling, speech change, weakness and headaches.  Endo/Heme/Allergies:  Negative for polydipsia. Does not bruise/bleed easily.  Psychiatric/Behavioral:  Negative for depression and memory loss. The patient is not nervous/anxious and does not have insomnia.        06/29/2021    8:43 AM 04/15/2021    8:32 AM 06/24/2020   10:40 AM 06/14/2018    9:06 AM 03/23/2017   12:13 PM  Depression screen PHQ 2/9  Decreased Interest 0 0 0 0 0  Down,  Depressed, Hopeless 0 0 0 0 0  PHQ - 2 Score 0 0 0 0 0        Objective:  BP (!) 146/110   Pulse 80   Ht 5' 10"  (1.778 m)   Wt 250 lb 9.6 oz (113.7 kg)   BMI 35.96 kg/m   General appearance: alert, no distress, WD/WN, African American male Skin: tattoos bilat forearms, otherwise unremarkable HEENT: normocephalic, conjunctiva/corneas normal, sclerae anicteric, PERRLA, EOMi Oral cavity: MMM, tongue normal, teeth normal Neck: supple, no lymphadenopathy, no thyromegaly, no masses, normal ROM, no bruits Chest: non tender, normal shape and expansion Heart: RRR, normal S1, S2, no murmurs Lungs: CTA bilaterally, no wheezes, rhonchi, or rales Abdomen: +bs, soft, non tender, non distended, no masses, no hepatomegaly, no splenomegaly, no bruits Back: non tender, normal ROM, no scoliosis Musculoskeletal: upper extremities non tender, no obvious deformity, normal ROM throughout, lower extremities non tender, no obvious deformity, normal ROM throughout Extremities: no edema, no cyanosis, no clubbing Pulses: 2+ symmetric, upper and lower extremities, normal cap refill Neurological: alert, oriented x 3, CN2-12 intact, strength normal upper extremities and lower extremities, sensation normal throughout, DTRs 2+ throughout, no cerebellar signs, gait normal Psychiatric: normal affect, behavior normal, pleasant  GU: normal male external genitalia,circumcised, nontender, no masses, no hernia, no lymphadenopathy Rectal: deferred   Assessment and Plan :   Encounter Diagnoses  Name Primary?   Routine general medical examination at a health care facility Yes   Weight gain    Screening for lipid disorders    Indigestion    Gastroesophageal reflux disease, unspecified whether esophagitis present    Moderate persistent extrinsic asthma without complication    Allergic rhinitis due to pollen, unspecified seasonality    Essential hypertension, benign    Other fatigue    Impaired fasting blood sugar     Muscle spasm     This visit was a preventative care visit, also known as wellness visit or routine physical.   Topics typically include healthy lifestyle, diet, exercise, preventative care, vaccinations, sick and well care, proper use of emergency dept and after hours care, as well as other concerns.     Recommendations: Continue to return yearly for your annual wellness and preventative care visits.  This gives [place] a chance to discuss healthy lifestyle, exercise, vaccinations, review your chart record, and perform screenings where appropriate.  I recommend you see your eye doctor yearly for routine vision care.  I recommend you see your dentist yearly for routine dental care including hygiene visits twice yearly.   Vaccination recommendations were reviewed Immunization History  Administered Date(s) Administered   DTaP 07/17/1996, 11/26/1996, 08/28/1997, 07/08/1998, 07/31/2001   Hepatitis A 07/06/2007, 05/31/2010   Hepatitis B 05-20-1996, 07/17/1996, 08/28/1997   HiB (PRP-OMP) 07/17/1996, 11/26/1996, 11/17/1997, 07/08/1998   IPV 07/17/1996, 11/26/1996, 08/28/1997, 07/31/2001   MMR 08/28/1997, 07/31/2001  MenQuadfi_Meningococcal Groups ACYW Conjugate 07/06/2007   Meningococcal Polysaccharide 07/06/2007   PFIZER(Purple Top)SARS-COV-2 Vaccination 10/17/2019, 11/07/2019   Pneumococcal-Unspecified 12/18/1998   Td 07/06/2007, 06/14/2018   Varicella 11/17/1997, 05/31/2010     Screening for cancer: Colon cancer screening: Age 60  Testicular cancer screening You should do a monthly self testicular exam if you are between 77-16 years old  We discussed PSA, prostate exam, and prostate cancer screening risks/benefits.   Age 7  Skin cancer screening: Check your skin regularly for new changes, growing lesions, or other lesions of concern Come in for evaluation if you have skin lesions of concern.  Lung cancer screening: If you have a greater than 20 pack year history of tobacco  use, then you may qualify for lung cancer screening with a chest CT scan.   Please call your insurance company to inquire about coverage for this test.  We currently don't have screenings for other cancers besides breast, cervical, colon, and lung cancers.  If you have a strong family history of cancer or have other cancer screening concerns, please let me know.    Bone health: Get at least 150 minutes of aerobic exercise weekly Get weight bearing exercise at least once weekly Bone density test:  A bone density test is an imaging test that uses a type of X-ray to measure the amount of calcium and other minerals in your bones. The test may be used to diagnose or screen you for a condition that causes weak or thin bones (osteoporosis), predict your risk for a broken bone (fracture), or determine how well your osteoporosis treatment is working. The bone density test is recommended for females 53 and older, or females or males <62 if certain risk factors such as thyroid disease, long term use of steroids such as for asthma or rheumatological issues, vitamin D deficiency, estrogen deficiency, family history of osteoporosis, self or family history of fragility fracture in first degree relative.    Heart health: Get at least 150 minutes of aerobic exercise weekly Limit alcohol It is important to maintain a healthy blood pressure and healthy cholesterol numbers  Heart disease screening: Screening for heart disease includes screening for blood pressure, fasting lipids, glucose/diabetes screening, BMI height to weight ratio, reviewed of smoking status, physical activity, and diet.    Goals include blood pressure 120/80 or less, maintaining a healthy lipid/cholesterol profile, preventing diabetes or keeping diabetes numbers under good control, not smoking or using tobacco products, exercising most days per week or at least 150 minutes per week of exercise, and eating healthy variety of fruits and  vegetables, healthy oils, and avoiding unhealthy food choices like fried food, fast food, high sugar and high cholesterol foods.    Other tests may possibly include EKG test, CT coronary calcium score, echocardiogram, exercise treadmill stress test.    Medical care options: I recommend you continue to seek care here first for routine care.  We try really hard to have available appointments Monday through Friday daytime hours for sick visits, acute visits, and physicals.  Urgent care should be used for after hours and weekends for significant issues that cannot wait till the next day.  The emergency department should be used for significant potentially life-threatening emergencies.  The emergency department is expensive, can often have long wait times for less significant concerns, so try to utilize primary care, urgent care, or telemedicine when possible to avoid unnecessary trips to the emergency department.  Virtual visits and telemedicine have been introduced since the pandemic  started in 2020, and can be convenient ways to receive medical care.  We offer virtual appointments as well to assist you in a variety of options to seek medical care.    Separate significant issues discussed: Problem List Items Addressed This Visit     Routine general medical examination at a health care facility - Primary   Relevant Orders   Comprehensive metabolic panel   CBC   Hemoglobin A1c   Urinalysis, Routine w reflex microscopic   Lipid panel   POCT Urinalysis DIP (Proadvantage Device) (Completed)   Visual acuity screening   Cortisol   Insulin, random   Vitamin B12   TSH + free T4   Extrinsic asthma    Allergic asthma, continue antihistamine and preventative inhaler for the next several weeks until pollen has calmed down enough where he is not having problems.  Continue albuterol as needed.       Allergic rhinitis due to pollen   Screening for lipid disorders   Relevant Orders   Lipid panel    Gastroesophageal reflux disease    Avoid reflux triggers, work on losing weight, begin medication as below, famotidine       Relevant Medications   famotidine (PEPCID) 40 MG tablet   Indigestion   Weight gain    Work on efforts to lose weight through healthy diet and exercise       Essential hypertension, benign    He has been running elevated blood pressures in the last year.  Unfortunately now his blood pressures are consistently high.  Begin medication valsartan HCT.  Discussed risk and benefits and proper use of medication.  Routine labs today.  He had concerns about maybe seeing cardiology for other testing.  I gave him the options for the referral but after discussion he did not want to move forward with this just yet.  Limit salt.  Work on [employer] and exercise and trying to lose weight.       Relevant Medications   valsartan-hydrochlorothiazide (DIOVAN-HCT) 80-12.5 MG tablet   Other Relevant Orders   POCT Urinalysis DIP (Proadvantage Device) (Completed)   Other fatigue   Relevant Orders   Cortisol   Vitamin B12   TSH + free T4   Muscle spasm    Work on good hydration, stretching, can use muscle laxer as needed       Impaired fasting blood sugar   Relevant Orders   Hemoglobin A1c   Cortisol   Insulin, random    Follow-up pending labs, yearly for physical

## 2021-06-29 NOTE — Assessment & Plan Note (Signed)
Allergic asthma, continue antihistamine and preventative inhaler for the next several weeks until pollen has calmed down enough where he is not having problems.  Continue albuterol as needed.

## 2021-06-29 NOTE — Assessment & Plan Note (Signed)
He has been running elevated blood pressures in the last year.  Unfortunately now his blood pressures are consistently high.  Begin medication valsartan HCT.  Discussed risk and benefits and proper use of medication.  Routine labs today.  He had concerns about maybe seeing cardiology for other testing.  I gave him the options for the referral but after discussion he did not want to move forward with this just yet.  Limit salt.  Work on [employer] and exercise and trying to lose weight.

## 2021-06-30 LAB — URINALYSIS, ROUTINE W REFLEX MICROSCOPIC
Bilirubin, UA: NEGATIVE
Glucose, UA: NEGATIVE
Ketones, UA: NEGATIVE
Leukocytes,UA: NEGATIVE
Nitrite, UA: NEGATIVE
Protein,UA: NEGATIVE
RBC, UA: NEGATIVE
Specific Gravity, UA: 1.02 (ref 1.005–1.030)
Urobilinogen, Ur: 0.2 mg/dL (ref 0.2–1.0)
pH, UA: 5.5 (ref 5.0–7.5)

## 2021-06-30 LAB — LIPID PANEL
Chol/HDL Ratio: 4.8 ratio (ref 0.0–5.0)
Cholesterol, Total: 197 mg/dL (ref 100–199)
HDL: 41 mg/dL (ref >39)
LDL Chol Calc (NIH): 129 mg/dL — ABNORMAL HIGH (ref 0–99)
Triglycerides: 152 mg/dL — ABNORMAL HIGH (ref 0–149)
VLDL Cholesterol Cal: 27 mg/dL (ref 5–40)

## 2021-06-30 LAB — COMPREHENSIVE METABOLIC PANEL
ALT: 60 IU/L — ABNORMAL HIGH (ref 0–44)
AST: 29 IU/L (ref 0–40)
Albumin/Globulin Ratio: 1.4 (ref 1.2–2.2)
Albumin: 4.5 g/dL (ref 4.1–5.2)
Alkaline Phosphatase: 106 IU/L (ref 44–121)
BUN/Creatinine Ratio: 13 (ref 9–20)
BUN: 11 mg/dL (ref 6–20)
Bilirubin Total: 0.3 mg/dL (ref 0.0–1.2)
CO2: 20 mmol/L (ref 20–29)
Calcium: 9.8 mg/dL (ref 8.7–10.2)
Chloride: 102 mmol/L (ref 96–106)
Creatinine, Ser: 0.82 mg/dL (ref 0.76–1.27)
Globulin, Total: 3.2 g/dL (ref 1.5–4.5)
Glucose: 155 mg/dL — ABNORMAL HIGH (ref 70–99)
Potassium: 4.3 mmol/L (ref 3.5–5.2)
Sodium: 138 mmol/L (ref 134–144)
Total Protein: 7.7 g/dL (ref 6.0–8.5)
eGFR: 125 mL/min/{1.73_m2} (ref >59)

## 2021-06-30 LAB — CBC
Hematocrit: 44.5 % (ref 37.5–51.0)
Hemoglobin: 14.8 g/dL (ref 13.0–17.7)
MCH: 28.4 pg (ref 26.6–33.0)
MCHC: 33.3 g/dL (ref 31.5–35.7)
MCV: 85 fL (ref 79–97)
Platelets: 263 10*3/uL (ref 150–450)
RBC: 5.22 x10E6/uL (ref 4.14–5.80)
RDW: 12.8 % (ref 11.6–15.4)
WBC: 7.7 10*3/uL (ref 3.4–10.8)

## 2021-06-30 LAB — INSULIN, RANDOM: INSULIN: 27.5 u[IU]/mL — ABNORMAL HIGH (ref 2.6–24.9)

## 2021-06-30 LAB — CORTISOL: Cortisol: 12.1 ug/dL (ref 6.2–19.4)

## 2021-06-30 LAB — VITAMIN B12: Vitamin B-12: 704 pg/mL (ref 232–1245)

## 2021-06-30 LAB — TSH+FREE T4
Free T4: 0.99 ng/dL (ref 0.82–1.77)
TSH: 1.78 u[IU]/mL (ref 0.450–4.500)

## 2021-06-30 LAB — HEMOGLOBIN A1C
Est. average glucose Bld gHb Est-mCnc: 151 mg/dL
Hgb A1c MFr Bld: 6.9 % — ABNORMAL HIGH (ref 4.8–5.6)

## 2021-08-02 ENCOUNTER — Encounter: Payer: Self-pay | Admitting: Medical

## 2021-08-02 ENCOUNTER — Ambulatory Visit: Payer: 59 | Admitting: Medical

## 2021-08-02 VITALS — BP 130/80 | HR 72 | Wt 225.4 lb

## 2021-08-02 DIAGNOSIS — R635 Abnormal weight gain: Secondary | ICD-10-CM | POA: Diagnosis not present

## 2021-08-02 DIAGNOSIS — R739 Hyperglycemia, unspecified: Secondary | ICD-10-CM | POA: Insufficient documentation

## 2021-08-02 DIAGNOSIS — I10 Essential (primary) hypertension: Secondary | ICD-10-CM

## 2021-08-02 DIAGNOSIS — R7303 Prediabetes: Secondary | ICD-10-CM | POA: Insufficient documentation

## 2021-08-04 ENCOUNTER — Other Ambulatory Visit (HOSPITAL_COMMUNITY): Payer: Self-pay

## 2021-08-04 MED ORDER — FREESTYLE LITE W/DEVICE KIT
PACK | 0 refills | Status: AC
  Filled 2021-08-04: qty 1, 30d supply, fill #0

## 2021-08-04 MED ORDER — FREESTYLE LANCETS MISC
3 refills | Status: AC
  Filled 2021-08-04: qty 100, 90d supply, fill #0

## 2021-08-04 MED ORDER — FREESTYLE TEST VI STRP
ORAL_STRIP | 3 refills | Status: DC
  Filled 2021-08-04: qty 100, 90d supply, fill #0

## 2021-08-06 ENCOUNTER — Other Ambulatory Visit (HOSPITAL_COMMUNITY): Payer: Self-pay

## 2021-08-14 ENCOUNTER — Other Ambulatory Visit (HOSPITAL_COMMUNITY): Payer: Self-pay

## 2021-09-07 ENCOUNTER — Other Ambulatory Visit (HOSPITAL_COMMUNITY): Payer: Self-pay

## 2021-10-04 ENCOUNTER — Other Ambulatory Visit: Payer: Self-pay | Admitting: Medical

## 2021-10-04 ENCOUNTER — Other Ambulatory Visit (HOSPITAL_COMMUNITY): Payer: Self-pay

## 2021-10-04 MED ORDER — FAMOTIDINE 40 MG PO TABS
40.0000 mg | ORAL_TABLET | Freq: Every day | ORAL | 2 refills | Status: DC
Start: 2021-10-04 — End: 2022-01-25
  Filled 2021-10-04: qty 30, 30d supply, fill #0
  Filled 2021-11-18: qty 30, 30d supply, fill #1
  Filled 2021-12-23: qty 30, 30d supply, fill #2

## 2021-10-05 ENCOUNTER — Other Ambulatory Visit (HOSPITAL_COMMUNITY): Payer: Self-pay

## 2021-10-13 ENCOUNTER — Encounter: Payer: Self-pay | Admitting: Internal Medicine

## 2021-10-29 ENCOUNTER — Telehealth: Payer: Self-pay | Admitting: Medical

## 2021-10-29 NOTE — Telephone Encounter (Signed)
Pt needs letter for emotional support animal for his dog, he has anxiety and high stress & his dog helps him a lot.   His apartment complex is now requiring a letter in order for him to keep it & not have to pay a large pet deposit.     I advised of our policy that yall do not do that & he needs to get that from his therapist but he states he does not have a therapist or psychiatrist or psychologist.  He would like your recommendations

## 2021-11-02 ENCOUNTER — Ambulatory Visit: Payer: 59 | Admitting: Medical

## 2021-11-04 ENCOUNTER — Other Ambulatory Visit (HOSPITAL_COMMUNITY): Payer: Self-pay

## 2021-11-04 ENCOUNTER — Other Ambulatory Visit: Payer: Self-pay | Admitting: Family Medicine

## 2021-11-04 DIAGNOSIS — J4540 Moderate persistent asthma, uncomplicated: Secondary | ICD-10-CM

## 2021-11-04 MED ORDER — ALBUTEROL SULFATE HFA 108 (90 BASE) MCG/ACT IN AERS
INHALATION_SPRAY | RESPIRATORY_TRACT | 1 refills | Status: DC
  Filled 2021-11-04: qty 6.7, 25d supply, fill #0

## 2021-11-04 NOTE — Telephone Encounter (Signed)
[facility] is requesting to fill pt albuterol. Please advise due to pt appt 11/10/21. [place]

## 2021-11-08 NOTE — Telephone Encounter (Signed)
Pt had an appt with Audelia Acton 11/02/21 & then he r/s til 10/4.  I left message for pt

## 2021-11-10 ENCOUNTER — Other Ambulatory Visit (HOSPITAL_COMMUNITY): Payer: Self-pay

## 2021-11-10 ENCOUNTER — Ambulatory Visit: Payer: 59 | Admitting: Medical

## 2021-11-10 VITALS — BP 120/80 | HR 82 | Wt 243.8 lb

## 2021-11-10 DIAGNOSIS — R7989 Other specified abnormal findings of blood chemistry: Secondary | ICD-10-CM

## 2021-11-10 DIAGNOSIS — Z6834 Body mass index (BMI) 34.0-34.9, adult: Secondary | ICD-10-CM

## 2021-11-10 DIAGNOSIS — J4540 Moderate persistent asthma, uncomplicated: Secondary | ICD-10-CM | POA: Diagnosis not present

## 2021-11-10 DIAGNOSIS — I10 Essential (primary) hypertension: Secondary | ICD-10-CM | POA: Diagnosis not present

## 2021-11-10 DIAGNOSIS — Z2821 Immunization not carried out because of patient refusal: Secondary | ICD-10-CM | POA: Insufficient documentation

## 2021-11-10 DIAGNOSIS — E118 Type 2 diabetes mellitus with unspecified complications: Secondary | ICD-10-CM

## 2021-11-10 LAB — POCT GLYCOSYLATED HEMOGLOBIN (HGB A1C): Hemoglobin A1C: 6.8 % — AB (ref 4.0–5.6)

## 2021-11-10 MED ORDER — METFORMIN HCL 500 MG PO TABS
500.0000 mg | ORAL_TABLET | Freq: Every day | ORAL | 3 refills | Status: DC
  Filled 2021-11-10: qty 90, 90d supply, fill #0

## 2021-11-10 MED ORDER — FLUTICASONE PROPIONATE HFA 44 MCG/ACT IN AERO
INHALATION_SPRAY | RESPIRATORY_TRACT | 5 refills | Status: DC
  Filled 2021-11-10 – 2022-01-25 (×2): qty 10.6, 30d supply, fill #0

## 2021-11-10 MED ORDER — ALBUTEROL SULFATE HFA 108 (90 BASE) MCG/ACT IN AERS
INHALATION_SPRAY | RESPIRATORY_TRACT | 1 refills | Status: DC
  Filled 2021-11-10: qty 6.7, fill #0

## 2021-11-10 NOTE — Progress Notes (Signed)
Subjective:  Daniel Wood is a 26 y.o. male who presents for Chief Complaint  Patient presents with   3 month follow-up    3 month follow-up on sugars and bp and liver enzmyes, declines flu shot       Here for med check.    Hypertension-compliant with valsartan HCT 80/12.5 mg daily.  No concerns with the medication, no chest pain, no palpitations.  Doing fine with this  He still does not want to except the diabetes diagnosis.  Not currently taking medications for this.  He tried the freestyle libre few times but is not currently checking sugars.  He has tried to do better with diet and exercise since the diagnosis in May.  However he did go to the beach in July had some diet indiscretion then.  He is trying to keep things under control now.  He is active and exercising.  No polyuria, no polydipsia, no blurred vision  No other aggravating or relieving factors.    No other c/o.  The following portions of the patient's history were reviewed and updated as appropriate: allergies, current medications, past family history, past medical history, past social history, past surgical history and problem list.  Past Medical History:  Diagnosis Date   Allergy    Asthma    Current Outpatient Medications on File Prior to Visit  Medication Sig Dispense Refill   cetirizine (ZYRTEC) 10 MG tablet Take 10 mg by mouth daily.     famotidine (PEPCID) 40 MG tablet Take 1 tablet (40 mg total) by mouth daily. 30 tablet 2   valsartan-hydrochlorothiazide (DIOVAN-HCT) 80-12.5 MG tablet Take 1 tablet by mouth daily. 90 tablet 3   Blood Glucose Monitoring Suppl (FREESTYLE LITE) w/Device KIT Use as directed to check blood glucose daily. (Patient not taking: Reported on 11/10/2021) 1 kit 0   glucose blood (FREESTYLE TEST STRIPS) test strip Use as directed to check blood glucose daily. 100 each 3   Lancets (FREESTYLE) lancets Use as directed to check blood glucose levels daily. 100 each 3   No current  facility-administered medications on file prior to visit.     ROS Otherwise as in subjective above  Objective: BP 120/80   Pulse 82   Wt 243 lb 12.8 oz (110.6 kg)   BMI 34.98 kg/m   General appearance: alert, no distress, well developed, well nourished Neck: supple, no lymphadenopathy, no thyromegaly, no masses Heart: RRR, normal S1, S2, no murmurs Lungs: CTA bilaterally, no wheezes, rhonchi, or rales Pulses: 2+ radial pulses, 2+ pedal pulses, normal cap refill Ext: no edema  Diabetic Foot Exam - Simple   Simple Foot Form Diabetic Foot exam was performed with the following findings: Yes 11/10/2021 11:04 AM  Visual Inspection See comments: Yes Sensation Testing Intact to touch and monofilament testing bilaterally: Yes Pulse Check Posterior Tibialis and Dorsalis pulse intact bilaterally: Yes Comments Dark brown left great toenail with new fresh nail coming in from the base, status post recent subungual hematoma      Assessment: Encounter Diagnoses  Name Primary?   Essential hypertension, benign Yes   Elevated LFTs    Type II diabetes mellitus with complication (HCC)    BMI 34.0-34.9,adult    Influenza vaccination declined    Moderate persistent extrinsic asthma without complication      Plan: Hypertension at goal- continue current medication  BMI greater than 34-continue efforts to lose weight through healthy diet and exercise  Diabetes-hemoglobin A1c at goal today.  He may consider  medication, metformin sent. He mainly wants to continue working on dietary and exercise and weight loss efforts to control sugars.  Discussed long term complications of diabetes.     Elevated liver test likely due to fatty liver disease-we will plan to recheck this within the next several months.  Continue efforts to lose weight through healthy diet and exercise  Daniel Wood was seen today for 3 month follow-up.  Diagnoses and all orders for this visit:  Essential hypertension,  benign  Elevated LFTs  Type II diabetes mellitus with complication (HCC) -     HgB A1c  BMI 34.0-34.9,adult  Influenza vaccination declined  Moderate persistent extrinsic asthma without complication -     albuterol (VENTOLIN HFA) 108 (90 Base) MCG/ACT inhaler; INHALE 2 PUFFS BY MOUTH EVERY 6 HOURS AS NEEDED FOR WHEEZE OR SHORTNESS OF BREATH  Other orders -     fluticasone (FLOVENT HFA) 44 MCG/ACT inhaler; Inhale 2 puffs into the lungs twice daily. -     metFORMIN (GLUCOPHAGE) 500 MG tablet; Take 1 tablet (500 mg total) by mouth daily with breakfast.    Follow up: 41mo

## 2021-11-18 ENCOUNTER — Other Ambulatory Visit (HOSPITAL_COMMUNITY): Payer: Self-pay

## 2021-12-23 ENCOUNTER — Other Ambulatory Visit (HOSPITAL_COMMUNITY): Payer: Self-pay

## 2022-01-25 ENCOUNTER — Other Ambulatory Visit: Payer: Self-pay

## 2022-01-25 ENCOUNTER — Other Ambulatory Visit: Payer: Self-pay | Admitting: Medical

## 2022-01-25 ENCOUNTER — Other Ambulatory Visit (HOSPITAL_COMMUNITY): Payer: Self-pay

## 2022-01-25 MED ORDER — FAMOTIDINE 40 MG PO TABS
40.0000 mg | ORAL_TABLET | Freq: Every day | ORAL | 2 refills | Status: DC
  Filled 2022-01-25 – 2022-02-02 (×2): qty 30, 30d supply, fill #0

## 2022-02-02 ENCOUNTER — Other Ambulatory Visit (HOSPITAL_COMMUNITY): Payer: Self-pay

## 2022-02-04 ENCOUNTER — Telehealth: Payer: 59 | Admitting: Family Medicine

## 2022-03-04 ENCOUNTER — Telehealth: Payer: BC Managed Care – PPO | Admitting: Physician Assistant

## 2022-03-04 DIAGNOSIS — J4540 Moderate persistent asthma, uncomplicated: Secondary | ICD-10-CM

## 2022-03-04 DIAGNOSIS — U071 COVID-19: Secondary | ICD-10-CM

## 2022-03-04 DIAGNOSIS — K219 Gastro-esophageal reflux disease without esophagitis: Secondary | ICD-10-CM

## 2022-03-04 MED ORDER — ALBUTEROL SULFATE HFA 108 (90 BASE) MCG/ACT IN AERS: 1.0000 | INHALATION_SPRAY | Freq: Four times a day (QID) | RESPIRATORY_TRACT | 1 refills | Status: DC | PRN

## 2022-03-04 MED ORDER — FLUTICASONE PROPIONATE 50 MCG/ACT NA SUSP: 2.0000 | Freq: Every day | NASAL | 0 refills | Status: DC

## 2022-03-04 MED ORDER — FAMOTIDINE 40 MG PO TABS: 40.0000 mg | ORAL_TABLET | Freq: Every day | ORAL | 0 refills | Status: DC

## 2022-03-04 NOTE — Progress Notes (Signed)
Virtual Visit Consent   Daniel Wood, you are scheduled for a virtual visit with a [facility] provider today. Just as with appointments in the office, your consent must be obtained to participate. Your consent will be active for this visit and any virtual visit you may have with one of our providers in the next 365 days. If you have a MyChart account, a copy of this consent can be sent to you electronically.  As this is a virtual visit, video technology does not allow for your provider to perform a traditional examination. This may limit your provider's ability to fully assess your condition. If your provider identifies any concerns that need to be evaluated in person or the need to arrange testing (such as labs, EKG, etc.), we will make arrangements to do so. Although advances in technology are sophisticated, we cannot ensure that it will always work on either your end or our end. If the connection with a video visit is poor, the visit may have to be switched to a telephone visit. With either a video or telephone visit, we are not always able to ensure that we have a secure connection.  By engaging in this virtual visit, you consent to the provision of healthcare and authorize for your insurance to be billed (if applicable) for the services provided during this visit. Depending on your insurance coverage, you may receive a charge related to this service.  I need to obtain your verbal consent now. Are you willing to proceed with your visit today? Daniel Wood has provided verbal consent on 03/04/2022 for a virtual visit (video or telephone). Daniel Daring, PA-C  Date: 03/04/2022 12:29 PM  Virtual Visit via Video Note   I, Daniel Wood, connected with  Daniel Wood  ([id number], [birth year]) on 03/04/22 at 12:15 PM EST by a video-enabled telemedicine application and verified that I am speaking with the correct person using two identifiers.  Location: Patient: Virtual Visit  Location Patient: Home Provider: Virtual Visit Location Provider: Home Office   I discussed the limitations of evaluation and management by telemedicine and the availability of in person appointments. The patient expressed understanding and agreed to proceed.    History of Present Illness: Daniel Wood is a 27 y.o. who identifies as a male who was assigned male at birth, and is being seen today for Covid 22.  HPI: URI  This is a new problem. The current episode started in the past 7 days (Tested positive for Covid 19 on at home test on Wedsnesday 03/02/22; symptoms started on Tuesday, 03/01/22). The problem has been gradually improving. There has been no fever. Associated symptoms include congestion, coughing, diarrhea (started yesterday), headaches, a plugged ear sensation (improving), rhinorrhea, sinus pain and a sore throat (improved now). Pertinent negatives include no ear pain, nausea or vomiting. Associated symptoms comments: Chills, body aches. He has tried acetaminophen (Emergen-C, sudafed) for the symptoms. The treatment provided no relief.     Problems:  Patient Active Problem List   Diagnosis Date Noted   Elevated LFTs 11/10/2021   Type II diabetes mellitus with complication ([place]) 26/94/8546   BMI 34.0-34.9,adult 11/10/2021   Influenza vaccination declined 11/10/2021   Prediabetes 08/02/2021   Hyperglycemia 08/02/2021   Essential hypertension, benign 06/29/2021   Other fatigue 06/29/2021   Muscle spasm 06/29/2021   Impaired fasting blood sugar 06/29/2021   Weight gain 06/24/2020   Elevated blood-pressure reading without diagnosis of hypertension 06/24/2020   Gastroesophageal reflux disease 12/27/2019  Indigestion 12/27/2019   Adjustment insomnia 10/30/2019   Sleep disturbance 10/30/2019   Screening for lipid disorders 06/24/2019   Extrinsic asthma 06/14/2018   Allergic rhinitis due to pollen 06/14/2018   Routine general medical examination at a health care facility  04/04/2017   ACNE VULGARIS, FACIAL, MILD 10/22/2008    Allergies: No Active Allergies Medications:  Current Outpatient Medications:    fluticasone (FLONASE) 50 MCG/ACT nasal spray, Place 2 sprays into both nostrils daily., Disp: 16 g, Rfl: 0   albuterol (VENTOLIN HFA) 108 (90 Base) MCG/ACT inhaler, Inhale 1-2 puffs into the lungs every 6 (six) hours as needed for wheezing or shortness of breath., Disp: 18 g, Rfl: 1   Blood Glucose Monitoring Suppl (FREESTYLE LITE) w/Device KIT, Use as directed to check blood glucose daily. (Patient not taking: Reported on 11/10/2021), Disp: 1 kit, Rfl: 0   cetirizine (ZYRTEC) 10 MG tablet, Take 10 mg by mouth daily., Disp: , Rfl:    famotidine (PEPCID) 40 MG tablet, Take 1 tablet (40 mg total) by mouth daily., Disp: 90 tablet, Rfl: 0   fluticasone (FLOVENT HFA) 44 MCG/ACT inhaler, Inhale 2 puffs into the lungs twice daily., Disp: 10.6 g, Rfl: 5   glucose blood (FREESTYLE TEST STRIPS) test strip, Use as directed to check blood glucose daily., Disp: 100 each, Rfl: 3   Lancets (FREESTYLE) lancets, Use as directed to check blood glucose levels daily., Disp: 100 each, Rfl: 3   metFORMIN (GLUCOPHAGE) 500 MG tablet, Take 1 tablet (500 mg total) by mouth daily with breakfast., Disp: 90 tablet, Rfl: 3   valsartan-hydrochlorothiazide (DIOVAN-HCT) 80-12.5 MG tablet, Take 1 tablet by mouth daily., Disp: 90 tablet, Rfl: 3  Observations/Objective: Patient is well-developed, well-nourished in no acute distress.  Resting comfortably at home.  Head is normocephalic, atraumatic.  No labored breathing.  Speech is clear and coherent with logical content.  Patient is alert and oriented at baseline.    Assessment and Plan: 1. COVID-19 - fluticasone (FLONASE) 50 MCG/ACT nasal spray; Place 2 sprays into both nostrils daily.  Dispense: 16 g; Refill: 0  2. Gastroesophageal reflux disease without esophagitis - famotidine (PEPCID) 40 MG tablet; Take 1 tablet (40 mg total) by mouth  daily.  Dispense: 90 tablet; Refill: 0  3. Moderate persistent extrinsic asthma without complication - albuterol (VENTOLIN HFA) 108 (90 Base) MCG/ACT inhaler; Inhale 1-2 puffs into the lungs every 6 (six) hours as needed for wheezing or shortness of breath.  Dispense: 18 g; Refill: 1  - Continue OTC symptomatic management of choice - Will send OTC vitamins and supplement information through [facility] - Flonase prescribed - Push fluids - Rest as needed - Discussed return precautions and when to seek in-person evaluation, sent via [facility] as well  - Famotidine and Albuterol refilled at patient request as other prescription refills from PCP office were sent to the wrong pharmacy and he has not been able to pick up.    Follow Up Instructions: I discussed the assessment and treatment plan with the patient. The patient was provided an opportunity to ask questions and all were answered. The patient agreed with the plan and demonstrated an understanding of the instructions.  A copy of instructions were sent to the patient via MyChart unless otherwise noted below.    The patient was advised to call back or seek an in-person evaluation if the symptoms worsen or if the condition fails to improve as anticipated.  Time:  I spent 15 minutes with the patient via telehealth technology  discussing the above problems/concerns.    Shaquala Broeker M Katharin Schneider, PA-C  

## 2022-03-04 NOTE — Patient Instructions (Signed)
Daniel Wood, thank you for joining Mar Daring, PA-C for today's virtual visit.  While this provider is not your primary care provider (PCP), if your PCP is located in our provider database this encounter information will be shared with them immediately following your visit.   [place] account gives you access to today's visit and all your visits, tests, and labs performed at [facility] " click here if you don't have a [place] account or go to [email or web address]  Consent: (Patient) Daniel Wood provided verbal consent for this virtual visit at the beginning of the encounter.  Current Medications:  Current Outpatient Medications:    fluticasone (FLONASE) 50 MCG/ACT nasal spray, Place 2 sprays into both nostrils daily., Disp: 16 g, Rfl: 0   albuterol (VENTOLIN HFA) 108 (90 Base) MCG/ACT inhaler, Inhale 1-2 puffs into the lungs every 6 (six) hours as needed for wheezing or shortness of breath., Disp: 18 g, Rfl: 1   Blood Glucose Monitoring Suppl (FREESTYLE LITE) w/Device KIT, Use as directed to check blood glucose daily. (Patient not taking: Reported on 11/10/2021), Disp: 1 kit, Rfl: 0   cetirizine (ZYRTEC) 10 MG tablet, Take 10 mg by mouth daily., Disp: , Rfl:    famotidine (PEPCID) 40 MG tablet, Take 1 tablet (40 mg total) by mouth daily., Disp: 90 tablet, Rfl: 0   fluticasone (FLOVENT HFA) 44 MCG/ACT inhaler, Inhale 2 puffs into the lungs twice daily., Disp: 10.6 g, Rfl: 5   glucose blood (FREESTYLE TEST STRIPS) test strip, Use as directed to check blood glucose daily., Disp: 100 each, Rfl: 3   Lancets (FREESTYLE) lancets, Use as directed to check blood glucose levels daily., Disp: 100 each, Rfl: 3   metFORMIN (GLUCOPHAGE) 500 MG tablet, Take 1 tablet (500 mg total) by mouth daily with breakfast., Disp: 90 tablet, Rfl: 3   valsartan-hydrochlorothiazide (DIOVAN-HCT) 80-12.5 MG tablet, Take 1 tablet by mouth daily., Disp: 90 tablet, Rfl: 3    Medications ordered in this encounter:  Meds ordered this encounter  Medications   fluticasone (FLONASE) 50 MCG/ACT nasal spray    Sig: Place 2 sprays into both nostrils daily.    Dispense:  16 g    Refill:  0    Order Specific Question:   Supervising Provider    Answer:   Chase Picket [[id number]]   famotidine (PEPCID) 40 MG tablet    Sig: Take 1 tablet (40 mg total) by mouth daily.    Dispense:  90 tablet    Refill:  0    Order Specific Question:   Supervising Provider    Answer:   Bari Mantis   albuterol (VENTOLIN HFA) 108 (90 Base) MCG/ACT inhaler    Sig: Inhale 1-2 puffs into the lungs every 6 (six) hours as needed for wheezing or shortness of breath.    Dispense:  18 g    Refill:  1    Order Specific Question:   Supervising Provider    Answer:   Chase Picket [id number]     *If you need refills on other medications prior to your next appointment, please contact your pharmacy*  Follow-Up: Call back or seek an in-person evaluation if the symptoms worsen or if the condition fails to improve as anticipated.  [place] [phone]  Isolation Instructions: You are to isolate at home for 5 days from onset of your symptoms. If you must be around other household members who do not have  symptoms, you need to make sure that both you and the family members are masking consistently with a high-quality mask.  After day 5 of isolation, if you have had no fever within 24 hours and you are feeling better, you can end isolation but need to mask for an additional 5 days.  After day 5 if you have a fever or are having significant symptoms, please isolate for full 10 days.  If you note any worsening of symptoms despite treatment, please seek an in-person evaluation ASAP. If you note any significant shortness of breath or any chest pain, please seek ER evaluation. Please do not delay care!   COVID-19: What to Do if You Are Sick If you test positive  and are an older adult or someone who is at high risk of getting very sick from COVID-19, treatment may be available. Contact a healthcare provider right away after a positive test to determine if you are eligible, even if your symptoms are mild right now. You can also visit a Test to Treat location and, if eligible, receive a prescription from a provider. Don't delay: Treatment must be started within the first few days to be effective. If you have a fever, cough, or other symptoms, you might have COVID-19. Most people have mild illness and are able to recover at home. If you are sick: Keep track of your symptoms. If you have an emergency warning sign (including trouble breathing), call 911. Steps to help prevent the spread of COVID-19 if you are sick If you are sick with COVID-19 or think you might have COVID-19, follow the steps below to care for yourself and to help protect other people in your home and community. Stay home except to get medical care Stay home. Most people with COVID-19 have mild illness and can recover at home without medical care. Do not leave your home, except to get medical care. Do not visit public areas and do not go to places where you are unable to wear a mask. Take care of yourself. Get rest and stay hydrated. Take over-the-counter medicines, such as acetaminophen, to help you feel better. Stay in touch with your doctor. Call before you get medical care. Be sure to get care if you have trouble breathing, or have any other emergency warning signs, or if you think it is an emergency. Avoid public transportation, ride-sharing, or taxis if possible. Get tested If you have symptoms of COVID-19, get tested. While waiting for test results, stay away from others, including staying apart from those living in your household. Get tested as soon as possible after your symptoms start. Treatments may be available for people with COVID-19 who are at risk for becoming very sick. Don't  delay: Treatment must be started early to be effective--some treatments must begin within 5 days of your first symptoms. Contact your healthcare provider right away if your test result is positive to determine if you are eligible. Self-tests are one of several options for testing for the virus that causes COVID-19 and may be more convenient than laboratory-based tests and point-of-care tests. Ask your healthcare provider or your local health department if you need help interpreting your test results. You can visit your state, tribal, local, and territorial health department's website to look for the latest local information on testing sites. Separate yourself from other people As much as possible, stay in a specific room and away from other people and pets in your home. If possible, you should use a separate bathroom. If  you need to be around other people or animals in or outside of the home, wear a well-fitting mask. Tell your close contacts that they may have been exposed to COVID-19. An infected person can spread COVID-19 starting 48 hours (or 2 days) before the person has any symptoms or tests positive. By letting your close contacts know they may have been exposed to COVID-19, you are helping to protect everyone. See COVID-19 and Animals if you have questions about pets. If you are diagnosed with COVID-19, someone from the health department may call you. Answer the call to slow the spread. Monitor your symptoms Symptoms of COVID-19 include fever, cough, or other symptoms. Follow care instructions from your healthcare provider and local health department. Your local health authorities may give instructions on checking your symptoms and reporting information. When to seek emergency medical attention Look for emergency warning signs* for COVID-19. If someone is showing any of these signs, seek emergency medical care immediately: Trouble breathing Persistent pain or pressure in the chest New  confusion Inability to wake or stay awake Pale, gray, or blue-colored skin, lips, or nail beds, depending on skin tone *This list is not all possible symptoms. Please call your medical provider for any other symptoms that are severe or concerning to you. Call 911 or call ahead to your local emergency facility: Notify the operator that you are seeking care for someone who has or may have COVID-19. Call ahead before visiting your doctor Call ahead. Many medical visits for routine care are being postponed or done by phone or telemedicine. If you have a medical appointment that cannot be postponed, call your doctor's office, and tell them you have or may have COVID-19. This will help the office protect themselves and other patients. If you are sick, wear a well-fitting mask You should wear a mask if you must be around other people or animals, including pets (even at home). Wear a mask with the best fit, protection, and comfort for you. You don't need to wear the mask if you are alone. If you can't put on a mask (because of trouble breathing, for example), cover your coughs and sneezes in some other way. Try to stay at least 6 feet away from other people. This will help protect the people around you. Masks should not be placed on young children under age 88 years, anyone who has trouble breathing, or anyone who is not able to remove the mask without help. Cover your coughs and sneezes Cover your mouth and nose with a tissue when you cough or sneeze. Throw away used tissues in a lined trash can. Immediately wash your hands with soap and water for at least 20 seconds. If soap and water are not available, clean your hands with an alcohol-based hand sanitizer that contains at least 60% alcohol. Clean your hands often Wash your hands often with soap and water for at least 20 seconds. This is especially important after blowing your nose, coughing, or sneezing; going to the bathroom; and before eating or  preparing food. Use hand sanitizer if soap and water are not available. Use an alcohol-based hand sanitizer with at least 60% alcohol, covering all surfaces of your hands and rubbing them together until they feel dry. Soap and water are the best option, especially if hands are visibly dirty. Avoid touching your eyes, nose, and mouth with unwashed hands. Handwashing Tips Avoid sharing personal household items Do not share dishes, drinking glasses, cups, eating utensils, towels, or bedding with other people  in your home. Wash these items thoroughly after using them with soap and water or put in the dishwasher. Clean surfaces in your home regularly Clean and disinfect high-touch surfaces (for example, doorknobs, tables, handles, light switches, and countertops) in your "sick room" and bathroom. In shared spaces, you should clean and disinfect surfaces and items after each use by the person who is ill. If you are sick and cannot clean, a caregiver or other person should only clean and disinfect the area around you (such as your bedroom and bathroom) on an as needed basis. Your caregiver/other person should wait as long as possible (at least several hours) and wear a mask before entering, cleaning, and disinfecting shared spaces that you use. Clean and disinfect areas that may have blood, stool, or body fluids on them. Use household cleaners and disinfectants. Clean visible dirty surfaces with household cleaners containing soap or detergent. Then, use a household disinfectant. Use a product from [employer] List N: Disinfectants for Coronavirus (COVID-19). Be sure to follow the instructions on the label to ensure safe and effective use of the product. Many products recommend keeping the surface wet with a disinfectant for a certain period of time (look at "contact time" on the product label). You may also need to wear personal protective equipment, such as gloves, depending on the directions on the product  label. Immediately after disinfecting, wash your hands with soap and water for 20 seconds. For completed guidance on cleaning and disinfecting your home, visit Complete Disinfection Guidance. Take steps to improve ventilation at home Improve ventilation (air flow) at home to help prevent from spreading COVID-19 to other people in your household. Clear out COVID-19 virus particles in the air by opening windows, using air filters, and turning on fans in your home. Use this interactive tool to learn how to improve air flow in your home. When you can be around others after being sick with COVID-19 Deciding when you can be around others is different for different situations. Find out when you can safely end home isolation. For any additional questions about your care, contact your healthcare provider or state or local health department. 04/28/2020 Content source: [facility] ([facility]), Division of Viral Diseases This information is not intended to replace advice given to you by your health care provider. Make sure you discuss any questions you have with your health care provider. Document Revised: 06/11/2020 Document Reviewed: 06/11/2020 Elsevier Patient Education  2022 [employer].    If you have been instructed to have an in-person evaluation today at a local Urgent Care facility, please use the link below. It will take you to a list of all of our available [place] [facility], including address, phone number and hours of operation. Please do not delay care.  [facility]  If you or a family member do not have a primary care provider, use the link below to schedule a visit and establish care. When you choose a [facility] primary care physician or advanced practice provider, you gain a long-term partner in health. Find a Primary Care Provider  Learn more about [facility]'s in-office and virtual care options: [place] - Get Care  Now

## 2022-03-14 ENCOUNTER — Ambulatory Visit: Payer: BC Managed Care – PPO | Admitting: Medical

## 2022-03-14 VITALS — BP 130/86 | HR 67 | Wt 237.6 lb

## 2022-03-14 DIAGNOSIS — E118 Type 2 diabetes mellitus with unspecified complications: Secondary | ICD-10-CM | POA: Diagnosis not present

## 2022-03-14 DIAGNOSIS — Z8616 Personal history of COVID-19: Secondary | ICD-10-CM

## 2022-03-14 DIAGNOSIS — I10 Essential (primary) hypertension: Secondary | ICD-10-CM

## 2022-03-14 DIAGNOSIS — Z6834 Body mass index (BMI) 34.0-34.9, adult: Secondary | ICD-10-CM

## 2022-03-14 DIAGNOSIS — J4540 Moderate persistent asthma, uncomplicated: Secondary | ICD-10-CM

## 2022-03-14 DIAGNOSIS — R41840 Attention and concentration deficit: Secondary | ICD-10-CM

## 2022-03-14 LAB — POCT URINALYSIS DIP (PROADVANTAGE DEVICE)
Bilirubin, UA: NEGATIVE
Blood, UA: NEGATIVE
Glucose, UA: NEGATIVE mg/dL
Ketones, POC UA: NEGATIVE mg/dL
Leukocytes, UA: NEGATIVE
Nitrite, UA: NEGATIVE
Specific Gravity, Urine: 1.02
Urobilinogen, Ur: NEGATIVE
pH, UA: 6 (ref 5.0–8.0)

## 2022-03-14 NOTE — Progress Notes (Signed)
Subjective:  Daniel Wood is a 27 y.o. male who presents for Chief Complaint  Patient presents with   4 month follow-up    4 month follow-up on labs- and needs recheck, not taking bp med or metformin      Here for med check.    Hypertension-new diagnosis 2023.  He quit taking his BP medicaiton few months ago as he was staying dizzy.  Checking BPs some.  His blood pressure cuff quit working recently as he needs to get a new one.  Not sure what his blood pressures have been lately.  No chest pain, no palpitations, no edema.  Diabetes-new diagnosis 2023 in May 2023 when he was 250 pounds.  Not currently taking metformin that was started last year.  No recent blood sugar readings.  No polydipsia or polyuria, no blurred vision.  Needs a work form/FMLA completed.  2 weeks ago was positive for covid. Works Friday - Sunday.  Ended up doing visit with urgent care, got a note for work.  Needs FMLA form filled out.  He missed work 1/26-1/28.  He went back to work this past Friday without restriction  Asthma-no recent issues  He thinks he may have ADD.  His sister has ADD and after he was looking at screening tools he has all the symptoms.  He notes that this runs in his family as well  No other aggravating or relieving factors.    No other c/o.  Past Medical History:  Diagnosis Date   Allergy    Asthma    Current Outpatient Medications on File Prior to Visit  Medication Sig Dispense Refill   albuterol (VENTOLIN HFA) 108 (90 Base) MCG/ACT inhaler Inhale 1-2 puffs into the lungs every 6 (six) hours as needed for wheezing or shortness of breath. 18 g 1   cetirizine (ZYRTEC) 10 MG tablet Take 10 mg by mouth daily.     famotidine (PEPCID) 40 MG tablet Take 1 tablet (40 mg total) by mouth daily. 90 tablet 0   Blood Glucose Monitoring Suppl (FREESTYLE LITE) w/Device KIT Use as directed to check blood glucose daily. (Patient not taking: Reported on 11/10/2021) 1 kit 0   fluticasone (FLONASE) 50  MCG/ACT nasal spray Place 2 sprays into both nostrils daily. (Patient not taking: Reported on 03/14/2022) 16 g 0   fluticasone (FLOVENT HFA) 44 MCG/ACT inhaler Inhale 2 puffs into the lungs twice daily. (Patient not taking: Reported on 03/14/2022) 10.6 g 5   glucose blood (FREESTYLE TEST STRIPS) test strip Use as directed to check blood glucose daily. 100 each 3   Lancets (FREESTYLE) lancets Use as directed to check blood glucose levels daily. 100 each 3   metFORMIN (GLUCOPHAGE) 500 MG tablet Take 1 tablet (500 mg total) by mouth daily with breakfast. (Patient not taking: Reported on 03/14/2022) 90 tablet 3   valsartan-hydrochlorothiazide (DIOVAN-HCT) 80-12.5 MG tablet Take 1 tablet by mouth daily. (Patient not taking: Reported on 03/14/2022) 90 tablet 3   No current facility-administered medications on file prior to visit.     The following portions of the patient's history were reviewed and updated as appropriate: allergies, current medications, past family history, past medical history, past social history, past surgical history and problem list.  ROS Otherwise as in subjective above    Objective: BP 130/86   Pulse 67   Wt 237 lb 9.6 oz (107.8 kg)   BMI 34.09 kg/m   Wt Readings from Last 3 Encounters:  03/14/22 237 lb 9.6 oz (  107.8 kg)  11/10/21 243 lb 12.8 oz (110.6 kg)  08/02/21 225 lb 6.4 oz (102.2 kg)   BP Readings from Last 3 Encounters:  03/14/22 130/86  11/10/21 120/80  08/02/21 130/80   General appearance: alert, no distress, well developed, well nourished Neck: supple, no lymphadenopathy, no thyromegaly, no masses Heart: RRR, normal S1, S2, no murmurs Lungs: CTA bilaterally, no wheezes, rhonchi, or rales Pulses: 2+ radial pulses, 2+ pedal pulses, normal cap refill Ext: no edema  Diabetic Foot Exam - Simple   Simple Foot Form Diabetic Foot exam was performed with the following findings: Yes 03/14/2022 11:00 AM  Visual Inspection No deformities, no ulcerations, no other  skin breakdown bilaterally: Yes Sensation Testing Intact to touch and monofilament testing bilaterally: Yes Pulse Check Posterior Tibialis and Dorsalis pulse intact bilaterally: Yes Comments      Assessment: Encounter Diagnoses  Name Primary?   Essential hypertension, benign Yes   Type II diabetes mellitus with complication (HCC)    Moderate persistent extrinsic asthma without complication    History of COVID-19    BMI 34.0-34.9,adult    Attention and concentration deficit      Plan: Hypertension Check your blood pressures at least a few days per week Goal is less than 130/80, but preferably 120/70 If your numbers are running higher than 130/80 then you need to begin back on 1/2 tablet of the valsartan HCT 80/12.5 mg daily  BMI 34 Try to get exercise several days per week Work on losing weight through healthy eating habits and exercise  Diabetes diagnosed 2023 Updated labs today, not currently taking medication Consider checking your blood sugars with a glucometer 2 to 3 days/week.  The goal is less than 130 fasting in the morning Continue efforts to lose weight through healthy diet and exercise Consider medication to help control sugars and to help control progression of diabetes, to help prevent complications and to help with weight loss  History of COVID I will work on completing the FMLA  Attention deficit concerns-referral to behavioral health   Daniel Wood was seen today for 4 month follow-up.  Diagnoses and all orders for this visit:  Essential hypertension, benign  Type II diabetes mellitus with complication (HCC) -     Microalbumin/Creatinine Ratio, Urine -     Hemoglobin A1c -     POCT Urinalysis DIP (Proadvantage Device)  Moderate persistent extrinsic asthma without complication  History of COVID-19  BMI 34.0-34.9,adult  Attention and concentration deficit -     Ambulatory referral to Behavioral Health   Spent > 45 minutes face to face with  patient in discussion of symptoms, evaluation, plan and recommendations.    Follow up: pending labs

## 2022-03-15 ENCOUNTER — Other Ambulatory Visit: Payer: Self-pay | Admitting: Medical

## 2022-03-15 NOTE — Progress Notes (Signed)
Results sent through MyChart

## 2022-03-17 LAB — MICROALBUMIN / CREATININE URINE RATIO
Creatinine, Urine: 276.8 mg/dL
Microalb/Creat Ratio: 8 mg/g creat (ref 0–29)
Microalbumin, Urine: 21.2 ug/mL

## 2022-03-17 LAB — HEMOGLOBIN A1C
Est. average glucose Bld gHb Est-mCnc: 154 mg/dL
Hgb A1c MFr Bld: 7 % — ABNORMAL HIGH (ref 4.8–5.6)

## 2022-03-17 NOTE — Progress Notes (Signed)
Results sent through MyChart

## 2022-04-08 ENCOUNTER — Telehealth: Payer: Self-pay | Admitting: Medical

## 2022-04-08 MED ORDER — METFORMIN HCL 500 MG PO TABS: 500.0000 mg | ORAL_TABLET | Freq: Every day | ORAL | 1 refills | Status: DC

## 2022-04-08 NOTE — Telephone Encounter (Signed)
Pt requesting refill on metformin to [id number] [place], [place] - [address]

## 2022-04-08 NOTE — Telephone Encounter (Signed)
sent 

## 2022-05-05 ENCOUNTER — Encounter (HOSPITAL_COMMUNITY): Payer: BC Managed Care – PPO | Admitting: Psychiatry

## 2022-05-05 ENCOUNTER — Encounter (HOSPITAL_COMMUNITY): Payer: Self-pay

## 2022-05-06 NOTE — Progress Notes (Signed)
This encounter was created in error - please disregard.

## 2022-05-19 ENCOUNTER — Other Ambulatory Visit: Payer: Self-pay | Admitting: Physician Assistant

## 2022-05-19 ENCOUNTER — Other Ambulatory Visit (HOSPITAL_COMMUNITY): Payer: Self-pay

## 2022-05-19 DIAGNOSIS — K219 Gastro-esophageal reflux disease without esophagitis: Secondary | ICD-10-CM

## 2022-05-19 MED ORDER — FAMOTIDINE 40 MG PO TABS: 40.0000 mg | ORAL_TABLET | Freq: Every day | ORAL | 0 refills | Status: DC

## 2022-06-25 HISTORY — PX: NO PAST SURGERIES: SHX2092

## 2022-07-05 ENCOUNTER — Encounter: Payer: 59 | Admitting: Medical

## 2022-07-07 ENCOUNTER — Encounter: Payer: 59 | Admitting: Medical

## 2022-07-13 ENCOUNTER — Other Ambulatory Visit: Payer: Self-pay | Admitting: Internal Medicine

## 2022-07-13 DIAGNOSIS — K219 Gastro-esophageal reflux disease without esophagitis: Secondary | ICD-10-CM

## 2022-07-13 MED ORDER — FAMOTIDINE 40 MG PO TABS: 40.0000 mg | ORAL_TABLET | Freq: Every day | ORAL | 0 refills | Status: DC

## 2022-07-13 NOTE — Telephone Encounter (Signed)
From: Billy Coast Vetter To: Office of [place], [place] Sent: 07/12/2022 7:02 PM EDT Subject: Medication Renewal Request  Refills have been requested for the following medications:   famotidine (PEPCID) 40 MG tablet [Shane Tysinger]  Preferred pharmacy: [id number] - [place], [place] - [address] Delivery method: Daryll Drown

## 2022-08-26 ENCOUNTER — Telehealth: Payer: Self-pay | Admitting: Medical

## 2022-08-26 MED ORDER — VALSARTAN-HYDROCHLOROTHIAZIDE 80-12.5 MG PO TABS: 1.0000 | ORAL_TABLET | Freq: Every day | ORAL | 0 refills | Status: DC

## 2022-08-26 NOTE — Telephone Encounter (Signed)
Left message for pt to call and schedule appt 

## 2022-08-26 NOTE — Telephone Encounter (Signed)
I refilled for 30 days but he needs a follow-up appt. Looks like he cancelled his appt in may. Last visit is [place]

## 2022-08-26 NOTE — Telephone Encounter (Signed)
Pt needs refill valsartan  [facility] [place]

## 2022-09-17 ENCOUNTER — Other Ambulatory Visit: Payer: Self-pay | Admitting: Medical

## 2022-09-30 ENCOUNTER — Telehealth: Payer: Self-pay

## 2022-09-30 ENCOUNTER — Other Ambulatory Visit: Payer: Self-pay | Admitting: Internal Medicine

## 2022-09-30 DIAGNOSIS — J4540 Moderate persistent asthma, uncomplicated: Secondary | ICD-10-CM

## 2022-09-30 DIAGNOSIS — K219 Gastro-esophageal reflux disease without esophagitis: Secondary | ICD-10-CM

## 2022-09-30 MED ORDER — ALBUTEROL SULFATE HFA 108 (90 BASE) MCG/ACT IN AERS
1.0000 | INHALATION_SPRAY | Freq: Four times a day (QID) | RESPIRATORY_TRACT | 1 refills | Status: AC | PRN
Start: 2022-09-30 — End: ?

## 2022-09-30 MED ORDER — FAMOTIDINE 40 MG PO TABS
40.0000 mg | ORAL_TABLET | Freq: Every day | ORAL | 0 refills | Status: AC
Start: 2022-09-30 — End: ?

## 2022-09-30 MED ORDER — VALSARTAN-HYDROCHLOROTHIAZIDE 80-12.5 MG PO TABS: 1.0000 | ORAL_TABLET | Freq: Every day | ORAL | 0 refills | Status: DC

## 2022-09-30 NOTE — Telephone Encounter (Signed)
Pt called requested a refill for his albuterol

## 2022-09-30 NOTE — Telephone Encounter (Signed)
done

## 2022-11-08 ENCOUNTER — Ambulatory Visit: Payer: BC Managed Care – PPO | Admitting: Medical

## 2022-11-08 ENCOUNTER — Encounter: Payer: Self-pay | Admitting: Medical

## 2022-11-08 VITALS — BP 130/80 | HR 80 | Ht 70.0 in | Wt 244.8 lb

## 2022-11-08 DIAGNOSIS — I10 Essential (primary) hypertension: Secondary | ICD-10-CM

## 2022-11-08 DIAGNOSIS — Z0000 Encounter for general adult medical examination without abnormal findings: Secondary | ICD-10-CM

## 2022-11-08 DIAGNOSIS — J4540 Moderate persistent asthma, uncomplicated: Secondary | ICD-10-CM

## 2022-11-08 DIAGNOSIS — Z2821 Immunization not carried out because of patient refusal: Secondary | ICD-10-CM

## 2022-11-08 DIAGNOSIS — E785 Hyperlipidemia, unspecified: Secondary | ICD-10-CM | POA: Diagnosis not present

## 2022-11-08 DIAGNOSIS — J301 Allergic rhinitis due to pollen: Secondary | ICD-10-CM

## 2022-11-08 DIAGNOSIS — E118 Type 2 diabetes mellitus with unspecified complications: Secondary | ICD-10-CM | POA: Diagnosis not present

## 2022-11-08 LAB — POCT URINALYSIS DIP (PROADVANTAGE DEVICE)
Bilirubin, UA: NEGATIVE
Blood, UA: NEGATIVE
Glucose, UA: NEGATIVE mg/dL
Ketones, POC UA: NEGATIVE mg/dL
Leukocytes, UA: NEGATIVE
Nitrite, UA: NEGATIVE
Protein Ur, POC: NEGATIVE mg/dL
Specific Gravity, Urine: 1.02
Urobilinogen, Ur: 0.2
pH, UA: 6 (ref 5.0–8.0)

## 2022-11-08 MED ORDER — ALBUTEROL SULFATE HFA 108 (90 BASE) MCG/ACT IN AERS
1.0000 | INHALATION_SPRAY | Freq: Four times a day (QID) | RESPIRATORY_TRACT | 1 refills | Status: DC | PRN
Start: 2022-11-08 — End: 2023-04-03

## 2022-11-08 NOTE — Progress Notes (Signed)
Subjective:   HPI  Daniel Wood is a 27 y.o. male who presents for Chief Complaint  Patient presents with   Annual Exam    Fasting (had a refresher from [place]) annual exam. Left knee pain is worsening, when he puts pressure on it.     Patient Care Team: Izell Labat, Cleda Mccreedy as PCP - General (Family Medicine) Sees dentist Sees eye doctor  Concerns: Not taking most of the listed medications.  Was trying to use holistic remedies and healthy diet measures.   Reviewed their medical, surgical, family, social, medication, and allergy history and updated chart as appropriate.  Past Medical History:  Diagnosis Date   Allergy    Asthma    Diabetes mellitus without complication (HCC) 2022    Past Surgical History:  Procedure Laterality Date   NO PAST SURGERIES  06/25/2022    Family History  Problem Relation Age of Onset   Asthma Sister    Diabetes Maternal Grandmother    Hypertension Maternal Grandmother    Heart disease Neg Hx    Stroke Neg Hx    Cancer Neg Hx      Current Outpatient Medications:    Blood Glucose Monitoring Suppl (FREESTYLE LITE) w/Device KIT, Use as directed to check blood glucose daily., Disp: 1 kit, Rfl: 0   glucose blood (FREESTYLE TEST STRIPS) test strip, Use as directed to check blood glucose daily., Disp: 100 each, Rfl: 3   Lancets (FREESTYLE) lancets, Use as directed to check blood glucose levels daily., Disp: 100 each, Rfl: 3   albuterol (VENTOLIN HFA) 108 (90 Base) MCG/ACT inhaler, Inhale 1-2 puffs into the lungs every 6 (six) hours as needed for wheezing or shortness of breath., Disp: 18 g, Rfl: 1  No Active Allergies   Review of Systems  Constitutional:  Negative for chills, fever, malaise/fatigue and weight loss.  HENT:  Negative for congestion, ear pain, hearing loss, sore throat and tinnitus.   Eyes:  Negative for blurred vision, pain and redness.  Respiratory:  Negative for cough, hemoptysis and shortness of breath.    Cardiovascular:  Negative for chest pain, palpitations, orthopnea, claudication and leg swelling.  Gastrointestinal:  Negative for abdominal pain, blood in stool, constipation, diarrhea, nausea and vomiting.  Genitourinary:  Negative for dysuria, flank pain, frequency, hematuria and urgency.  Musculoskeletal:  Negative for falls, joint pain and myalgias.  Skin:  Negative for itching and rash.  Neurological:  Negative for dizziness, tingling, speech change, weakness and headaches.  Endo/Heme/Allergies:  Negative for polydipsia. Does not bruise/bleed easily.  Psychiatric/Behavioral:  Negative for depression and memory loss. The patient is not nervous/anxious and does not have insomnia.         11/08/2022    1:51 PM 03/14/2022   10:21 AM 11/10/2021   10:36 AM 06/29/2021    8:43 AM 04/15/2021    8:32 AM  Depression screen PHQ 2/9  Decreased Interest 0 0 0 0 0  Down, Depressed, Hopeless 0 0 0 0 0  PHQ - 2 Score 0 0 0 0 0        Objective:  BP 130/80   Pulse 80   Ht 5\' 10"  (1.778 m)   Wt 244 lb 12.8 oz (111 kg)   BMI 35.13 kg/m   General appearance: alert, no distress, WD/WN, African American male Skin: tattoos bilat forearms, otherwise unremarkable HEENT: normocephalic, conjunctiva/corneas normal, sclerae anicteric, PERRLA, EOMi Oral cavity: MMM, tongue normal, teeth normal Neck: supple, no lymphadenopathy, no thyromegaly, no masses,  normal ROM, no bruits Chest: non tender, normal shape and expansion Heart: RRR, normal S1, S2, no murmurs Lungs: CTA bilaterally, no wheezes, rhonchi, or rales Abdomen: +bs, soft, non tender, non distended, no masses, no hepatomegaly, no splenomegaly, no bruits Back: non tender, normal ROM, no scoliosis Musculoskeletal: upper extremities non tender, no obvious deformity, normal ROM throughout, lower extremities non tender, no obvious deformity, normal ROM throughout Extremities: no edema, no cyanosis, no clubbing Pulses: 2+ symmetric, upper and lower  extremities, normal cap refill Neurological: alert, oriented x 3, CN2-12 intact, strength normal upper extremities and lower extremities, sensation normal throughout, DTRs 2+ throughout, no cerebellar signs, gait normal Psychiatric: normal affect, behavior normal, pleasant  GU: deferred Rectal: deferred  Diabetic Foot Exam - Simple   Simple Foot Form Diabetic Foot exam was performed with the following findings: Yes 11/08/2022  2:23 PM  Visual Inspection No deformities, no ulcerations, no other skin breakdown bilaterally: Yes Sensation Testing Intact to touch and monofilament testing bilaterally: Yes Pulse Check Posterior Tibialis and Dorsalis pulse intact bilaterally: Yes Comments      Assessment and Plan :   Encounter Diagnoses  Name Primary?   Routine general medical examination at a health care facility Yes   Type II diabetes mellitus with complication (HCC)    Allergic rhinitis due to pollen, unspecified seasonality    Essential hypertension, benign    Moderate persistent extrinsic asthma without complication    Hyperlipidemia, unspecified hyperlipidemia type    Influenza vaccination declined     This visit was a preventative care visit, also known as wellness visit or routine physical.   Topics typically include healthy lifestyle, diet, exercise, preventative care, vaccinations, sick and well care, proper use of emergency dept and after hours care, as well as other concerns.     Recommendations: Continue to return yearly for your annual wellness and preventative care visits.  This gives [place] a chance to discuss healthy lifestyle, exercise, vaccinations, review your chart record, and perform screenings where appropriate.  I recommend you see your eye doctor yearly for routine vision care.  I recommend you see your dentist yearly for routine dental care including hygiene visits twice yearly.   Vaccination recommendations were reviewed Immunization History  Administered  Date(s) Administered   DTaP 07/17/1996, 11/26/1996, 08/28/1997, 07/08/1998, 07/31/2001   HIB (PRP-OMP) 07/17/1996, 11/26/1996, 11/17/1997, 07/08/1998   Hepatitis A 07/06/2007, 05/31/2010   Hepatitis B 11-07-96, 07/17/1996, 08/28/1997   IPV 07/17/1996, 11/26/1996, 08/28/1997, 07/31/2001   MMR 08/28/1997, 07/31/2001   MenQuadfi_Meningococcal Groups ACYW Conjugate 07/06/2007   Meningococcal polysaccharide vaccine (MPSV4) 07/06/2007   PFIZER(Purple Top)SARS-COV-2 Vaccination 10/17/2019, 11/07/2019   Pneumococcal-Unspecified 12/18/1998   Td 07/06/2007, 06/14/2018   Varicella 11/17/1997, 05/31/2010   Declines flu shot  Screening for cancer: Colon cancer screening: Age 11  Testicular cancer screening You should do a monthly self testicular exam if you are between 1-95 years old  We discussed PSA, prostate exam, and prostate cancer screening risks/benefits.   Age 69  Skin cancer screening: Check your skin regularly for new changes, growing lesions, or other lesions of concern Come in for evaluation if you have skin lesions of concern.  Lung cancer screening: If you have a greater than 20 pack year history of tobacco use, then you may qualify for lung cancer screening with a chest CT scan.   Please call your insurance company to inquire about coverage for this test.  We currently don't have screenings for other cancers besides breast, cervical, colon, and lung cancers.  If you have a strong family history of cancer or have other cancer screening concerns, please let me know.    Bone health: Get at least 150 minutes of aerobic exercise weekly Get weight bearing exercise at least once weekly Bone density test:  A bone density test is an imaging test that uses a type of X-ray to measure the amount of calcium and other minerals in your bones. The test may be used to diagnose or screen you for a condition that causes weak or thin bones (osteoporosis), predict your risk for a broken bone  (fracture), or determine how well your osteoporosis treatment is working. The bone density test is recommended for females 65 and older, or females or males <65 if certain risk factors such as thyroid disease, long term use of steroids such as for asthma or rheumatological issues, vitamin D deficiency, estrogen deficiency, family history of osteoporosis, self or family history of fragility fracture in first degree relative.    Heart health: Get at least 150 minutes of aerobic exercise weekly Limit alcohol It is important to maintain a healthy blood pressure and healthy cholesterol numbers  Heart disease screening: Screening for heart disease includes screening for blood pressure, fasting lipids, glucose/diabetes screening, BMI height to weight ratio, reviewed of smoking status, physical activity, and diet.    Goals include blood pressure 120/80 or less, maintaining a healthy lipid/cholesterol profile, preventing diabetes or keeping diabetes numbers under good control, not smoking or using tobacco products, exercising most days per week or at least 150 minutes per week of exercise, and eating healthy variety of fruits and vegetables, healthy oils, and avoiding unhealthy food choices like fried food, fast food, high sugar and high cholesterol foods.    Other tests may possibly include EKG test, CT coronary calcium score, echocardiogram, exercise treadmill stress test.    Medical care options: I recommend you continue to seek care here first for routine care.  We try really hard to have available appointments Monday through Friday daytime hours for sick visits, acute visits, and physicals.  Urgent care should be used for after hours and weekends for significant issues that cannot wait till the next day.  The emergency department should be used for significant potentially life-threatening emergencies.  The emergency department is expensive, can often have long wait times for less significant concerns,  so try to utilize primary care, urgent care, or telemedicine when possible to avoid unnecessary trips to the emergency department.  Virtual visits and telemedicine have been introduced since the pandemic started in 2020, and can be convenient ways to receive medical care.  We offer virtual appointments as well to assist you in a variety of options to seek medical care.    Separate significant issues discussed: High blood pressure-currently not elevated off medication.  Advise he monitor his blood pressure at least weekly.  Goal less than 130/80  Diabetes-currently not taking metformin as he was having loose stools with this.  He wants to consider Ozempic or Mounjaro to help with weight loss and sugar control.  Follow-up pending labs Advise yearly eye doctor visit, daily glucose monitoring, daily foot checks  Hyperlipidemia-not compliant with statin.  Updated labs today  Asthma-no recent flares.  Albuterol as needed  Demarcus "Thurston Pounds" was seen today for annual exam.  Diagnoses and all orders for this visit:  Routine general medical examination at a health care facility -     Comprehensive metabolic panel -     CBC -  Hemoglobin A1c -     Lipid panel -     TSH -     POCT Urinalysis DIP (Proadvantage Device)  Type II diabetes mellitus with complication (HCC) -     Hemoglobin A1c  Allergic rhinitis due to pollen, unspecified seasonality  Essential hypertension, benign  Moderate persistent extrinsic asthma without complication -     albuterol (VENTOLIN HFA) 108 (90 Base) MCG/ACT inhaler; Inhale 1-2 puffs into the lungs every 6 (six) hours as needed for wheezing or shortness of breath.  Hyperlipidemia, unspecified hyperlipidemia type -     Lipid panel  Influenza vaccination declined    Follow-up pending labs, yearly for physical

## 2022-11-08 NOTE — Patient Instructions (Addendum)
Check insurance coverage for Jearld Lesch or Rybelsus diabetes medications  Our [facility] Address: [address] [place], [place] [address] [phone]

## 2022-11-09 ENCOUNTER — Other Ambulatory Visit: Payer: Self-pay | Admitting: Medical

## 2022-11-09 LAB — COMPREHENSIVE METABOLIC PANEL
ALT: 38 IU/L (ref 0–44)
AST: 21 IU/L (ref 0–40)
Albumin: 4.7 g/dL (ref 4.3–5.2)
Alkaline Phosphatase: 84 IU/L (ref 44–121)
BUN/Creatinine Ratio: 15 (ref 9–20)
BUN: 13 mg/dL (ref 6–20)
Bilirubin Total: 0.3 mg/dL (ref 0.0–1.2)
CO2: 23 mmol/L (ref 20–29)
Calcium: 9.9 mg/dL (ref 8.7–10.2)
Chloride: 102 mmol/L (ref 96–106)
Creatinine, Ser: 0.88 mg/dL (ref 0.76–1.27)
Globulin, Total: 3.3 g/dL (ref 1.5–4.5)
Glucose: 88 mg/dL (ref 70–99)
Potassium: 4 mmol/L (ref 3.5–5.2)
Sodium: 140 mmol/L (ref 134–144)
Total Protein: 8 g/dL (ref 6.0–8.5)
eGFR: 122 mL/min/{1.73_m2} (ref >59)

## 2022-11-09 LAB — CBC
Hematocrit: 42 % (ref 37.5–51.0)
Hemoglobin: 14 g/dL (ref 13.0–17.7)
MCH: 29 pg (ref 26.6–33.0)
MCHC: 33.3 g/dL (ref 31.5–35.7)
MCV: 87 fL (ref 79–97)
Platelets: 232 10*3/uL (ref 150–450)
RBC: 4.83 x10E6/uL (ref 4.14–5.80)
RDW: 13.3 % (ref 11.6–15.4)
WBC: 9.4 10*3/uL (ref 3.4–10.8)

## 2022-11-09 LAB — HEMOGLOBIN A1C
Est. average glucose Bld gHb Est-mCnc: 154 mg/dL
Hgb A1c MFr Bld: 7 % — ABNORMAL HIGH (ref 4.8–5.6)

## 2022-11-09 LAB — LIPID PANEL
Cholesterol, Total: 187 mg/dL (ref 100–199)
HDL: 36 mg/dL — ABNORMAL LOW (ref >39)
LDL CALC COMMENT:: 5.2 ratio — ABNORMAL HIGH (ref 0.0–5.0)
LDL Chol Calc (NIH): 126 mg/dL — ABNORMAL HIGH (ref 0–99)
Triglycerides: 136 mg/dL (ref 0–149)
VLDL Cholesterol Cal: 25 mg/dL (ref 5–40)

## 2022-11-09 LAB — TSH: TSH: 1.92 u[IU]/mL (ref 0.450–4.500)

## 2022-11-09 MED ORDER — ROSUVASTATIN CALCIUM 10 MG PO TABS: 10.0000 mg | ORAL_TABLET | Freq: Every day | ORAL | 3 refills | Status: DC

## 2022-11-09 MED ORDER — MOUNJARO 5 MG/0.5ML ~~LOC~~ SOAJ: 5.0000 mg | SUBCUTANEOUS | 1 refills | Status: DC

## 2022-11-09 MED ORDER — MOUNJARO 2.5 MG/0.5ML ~~LOC~~ SOAJ
2.5000 mg | SUBCUTANEOUS | 0 refills | Status: DC
Start: 2022-11-09 — End: 2023-04-03

## 2022-11-09 NOTE — Progress Notes (Signed)
Results sent through MyChart

## 2022-11-11 ENCOUNTER — Telehealth: Payer: Self-pay

## 2022-11-11 NOTE — Telephone Encounter (Signed)
KeyLeonard Downing PA Case ID #: [id number] Rx #: [id number] Status: sent iconSent to Plan today Drug: Greggory Keen 2.5MG /0.5ML auto-injectors Form: [employer] Electronic Prior Authorization Form (2017 NCPDP)

## 2022-11-14 NOTE — Telephone Encounter (Signed)
KeyLeonard Downing PA Case ID #: [id number] Rx #: [id number] Outcome: Approved on October 4 by [facility] 2017 NCPDP Request Reference Number: [id number].   MOUNJARO INJ 2.5/0.5 is approved through 11/11/2023. Your patient may now fill this prescription and it will be covered.  Authorization Expiration Date: 11/11/2023 Drug: Greggory Keen 2.5MG /0.5ML auto-injectors Form: [employer] Electronic Prior Authorization Form [phone] NCPDP)

## 2022-11-14 NOTE — Telephone Encounter (Signed)
Called pt, no answer, left voicemail. Approval faxed to pharmacy.

## 2022-11-15 ENCOUNTER — Telehealth: Payer: Self-pay | Admitting: Medical

## 2022-11-15 NOTE — Telephone Encounter (Signed)
Error

## 2023-01-25 ENCOUNTER — Telehealth: Payer: Self-pay | Admitting: Internal Medicine

## 2023-01-25 NOTE — Telephone Encounter (Signed)
Pt called and needs a refill on his Qvar inhaler. I do not see this as his current medication list

## 2023-01-26 ENCOUNTER — Other Ambulatory Visit: Payer: Self-pay | Admitting: Medical

## 2023-01-26 DIAGNOSIS — J4540 Moderate persistent asthma, uncomplicated: Secondary | ICD-10-CM

## 2023-01-26 MED ORDER — QVAR REDIHALER 80 MCG/ACT IN AERB
2.0000 | INHALATION_SPRAY | Freq: Two times a day (BID) | RESPIRATORY_TRACT | 2 refills | Status: DC
Start: 2023-01-26 — End: 2023-04-03

## 2023-02-15 ENCOUNTER — Telehealth: Payer: Self-pay | Admitting: Medical

## 2023-02-15 NOTE — Telephone Encounter (Signed)
 Pt called  Mounjaro too expensive wants different diabetic med other than Metformin

## 2023-02-16 ENCOUNTER — Other Ambulatory Visit: Payer: Self-pay | Admitting: Medical

## 2023-02-16 MED ORDER — LINAGLIPTIN 5 MG PO TABS: 5.0000 mg | ORAL_TABLET | Freq: Every day | ORAL | 0 refills | Status: DC

## 2023-03-12 ENCOUNTER — Other Ambulatory Visit: Payer: Self-pay | Admitting: Medical

## 2023-03-12 DIAGNOSIS — K219 Gastro-esophageal reflux disease without esophagitis: Secondary | ICD-10-CM

## 2023-03-13 NOTE — Telephone Encounter (Signed)
This was discontinued by shane

## 2023-03-16 ENCOUNTER — Telehealth: Payer: Self-pay

## 2023-03-16 MED ORDER — FAMOTIDINE 40 MG PO TABS: 40.0000 mg | ORAL_TABLET | Freq: Every day | ORAL | 5 refills | Status: DC

## 2023-03-16 NOTE — Telephone Encounter (Signed)
 Pt called to request Pepcid  to be sent to the [facility] on [address]

## 2023-03-16 NOTE — Telephone Encounter (Signed)
 done

## 2023-04-03 ENCOUNTER — Ambulatory Visit: Payer: BC Managed Care – PPO | Admitting: Medical

## 2023-04-03 VITALS — BP 130/80 | HR 73 | Wt 243.4 lb

## 2023-04-03 DIAGNOSIS — J4540 Moderate persistent asthma, uncomplicated: Secondary | ICD-10-CM | POA: Diagnosis not present

## 2023-04-03 DIAGNOSIS — Z6834 Body mass index (BMI) 34.0-34.9, adult: Secondary | ICD-10-CM

## 2023-04-03 DIAGNOSIS — I10 Essential (primary) hypertension: Secondary | ICD-10-CM

## 2023-04-03 DIAGNOSIS — E118 Type 2 diabetes mellitus with unspecified complications: Secondary | ICD-10-CM

## 2023-04-03 DIAGNOSIS — K219 Gastro-esophageal reflux disease without esophagitis: Secondary | ICD-10-CM

## 2023-04-03 DIAGNOSIS — E785 Hyperlipidemia, unspecified: Secondary | ICD-10-CM | POA: Insufficient documentation

## 2023-04-03 LAB — POCT GLYCOSYLATED HEMOGLOBIN (HGB A1C): Hemoglobin A1C: 7.2 % — AB (ref 4.0–5.6)

## 2023-04-03 MED ORDER — QVAR REDIHALER 80 MCG/ACT IN AERB: 2.0000 | INHALATION_SPRAY | Freq: Two times a day (BID) | RESPIRATORY_TRACT | 2 refills | Status: DC

## 2023-04-03 MED ORDER — MOUNJARO 2.5 MG/0.5ML ~~LOC~~ SOAJ: 2.5000 mg | SUBCUTANEOUS | 0 refills | Status: DC

## 2023-04-03 MED ORDER — ROSUVASTATIN CALCIUM 10 MG PO TABS: 10.0000 mg | ORAL_TABLET | ORAL | 1 refills | Status: DC

## 2023-04-03 MED ORDER — FREESTYLE TEST VI STRP: ORAL_STRIP | 3 refills | Status: DC

## 2023-04-03 MED ORDER — ALBUTEROL SULFATE HFA 108 (90 BASE) MCG/ACT IN AERS: 1.0000 | INHALATION_SPRAY | Freq: Four times a day (QID) | RESPIRATORY_TRACT | 1 refills | Status: DC | PRN

## 2023-04-03 MED ORDER — MOUNJARO 5 MG/0.5ML ~~LOC~~ SOAJ: 5.0000 mg | SUBCUTANEOUS | 1 refills | Status: DC

## 2023-04-03 MED ORDER — VALSARTAN-HYDROCHLOROTHIAZIDE 80-12.5 MG PO TABS: 1.0000 | ORAL_TABLET | Freq: Every day | ORAL | 2 refills | Status: DC

## 2023-04-03 MED ORDER — FAMOTIDINE 40 MG PO TABS: 40.0000 mg | ORAL_TABLET | Freq: Every day | ORAL | 2 refills | Status: DC

## 2023-04-03 NOTE — Progress Notes (Signed)
 Subjective:  Daniel Wood is a 28 y.o. male who presents for Chief Complaint  Patient presents with   Diabetes    Diabetes, no concerns, no eye exam done     Here for med check.  Diabetes-he has not started the [facility].  It was expensive.  He checks her sugars occasionally and they have been little bit.  He was also on steroid this past week for respiratory illness along with some antibiotics.  No polyuria, no polydipsia, no vision changes.  Hypertension-he notes that he has been taking valsartan HCT 80/12.5 mg, 1/2 tablet daily about 80% of the time  Hyperlipidemia-he never started Crestor  He has no other new complaints  No other aggravating or relieving factors.    No other c/o.  Past Medical History:  Diagnosis Date   Allergy    Asthma    Diabetes mellitus without complication (HCC) 2022   Current Outpatient Medications on File Prior to Visit  Medication Sig Dispense Refill   Blood Glucose Monitoring Suppl (FREESTYLE LITE) w/Device KIT Use as directed to check blood glucose daily. 1 kit 0   Lancets (FREESTYLE) lancets Use as directed to check blood glucose levels daily. 100 each 3   No current facility-administered medications on file prior to visit.     The following portions of the patient's history were reviewed and updated as appropriate: allergies, current medications, past family history, past medical history, past social history, past surgical history and problem list.  ROS Otherwise as in subjective above    Objective: BP 130/80   Pulse 73   Wt 243 lb 6.4 oz (110.4 kg)   SpO2 97%   BMI 34.92 kg/m   Wt Readings from Last 3 Encounters:  04/03/23 243 lb 6.4 oz (110.4 kg)  11/08/22 244 lb 12.8 oz (111 kg)  03/14/22 237 lb 9.6 oz (107.8 kg)   BP Readings from Last 3 Encounters:  04/03/23 130/80  11/08/22 130/80  03/14/22 130/86    General appearance: alert, no distress, well developed, well nourished Neck: supple, no lymphadenopathy, no  thyromegaly, no masses Heart: RRR, normal S1, S2, no murmurs Lungs: CTA bilaterally, no wheezes, rhonchi, or rales Pulses: 2+ radial pulses, 2+ pedal pulses, normal cap refill Ext: no edema  Diabetic Foot Exam - Simple   Simple Foot Form Diabetic Foot exam was performed with the following findings: Yes 04/03/2023 10:54 AM  Visual Inspection No deformities, no ulcerations, no other skin breakdown bilaterally: Yes Sensation Testing Intact to touch and monofilament testing bilaterally: Yes Pulse Check Posterior Tibialis and Dorsalis pulse intact bilaterally: Yes Comments      Assessment: Encounter Diagnoses  Name Primary?   Type II diabetes mellitus with complication (HCC) Yes   Essential hypertension, benign    Moderate persistent extrinsic asthma without complication    BMI 34.0-34.9,adult    Gastroesophageal reflux disease, unspecified whether esophagitis present    Hyperlipidemia, unspecified hyperlipidemia type      Plan: There are certain things that we call "standards of care" for all diabetics.  The goal diabetes care to prevent complications down the road.  Having diabetes at age 40 means you have a long time where things could potentially not go well.  Thus we want to do all we can to prevent problems down the road  This includes certain medications, routine follow-up visits, routine labs  For example, here are some general recommendations for diabetics: See your eye doctor yearly make sure they do a diabetic eye exam on  you yearly.  Make sure they send a copy of the results to [place] as well See your dentist twice year for cleaning and hygiene visits Check your feet daily for sores or wounds.  If you notice anything not healing or a new wound in come in for evaluation.  Diabetics are more likely to get amputations or infections of the foot that can become really problematic You should be good about taking her medications every day We use cholesterol medicine for diabetics  to prevent risk of heart disease and stroke and vascular disease We want to maintain a good blood pressure to avoid damage to the kidney and the heart.  Goal blood pressure is 120/70 or less Blood sugar goal is to be less than 130 fasting or before meal.  Hemoglobin A1c goal is less than 7% Exercise most days per week Your 5 4 3-1 diet plan sounds good including 5 vegetables daily, 4 fruits daily 3 or less meat servings daily. Limit or avoid junk food, large carbohydrate meals such as large quantities of pasta and bread and rice, avoid sugary drinks such as sweet tea, juice, and soda  I recommend you start rosuvastatin Crestor 10 mg 3 days/week, Monday Wednesday Friday for preventing heart disease and stroke and to lower your cholesterol  I recommend you take the whole tablet of your valsartan HCT 80/12.5 mg every day in the morning  I recommend you start the [facility] weekly injection to help lower your sugars and to help with weight loss.  Start out 2.5 mg weekly for the first month.  Then increase to 5 mg weekly.  If this medicine is too expensive then call your insurance to see what options would be cheaper.  Examples include Ozempic, Rybelsus, Trulicity, Greggory Keen   Daniel "Thurston Pounds" was seen today for diabetes.  Diagnoses and all orders for this visit:  Type II diabetes mellitus with complication (HCC) -     HgB A1c  Essential hypertension, benign  Moderate persistent extrinsic asthma without complication -     beclomethasone (QVAR REDIHALER) 80 MCG/ACT inhaler; Inhale 2 puffs into the lungs 2 (two) times daily. -     albuterol (VENTOLIN HFA) 108 (90 Base) MCG/ACT inhaler; Inhale 1-2 puffs into the lungs every 6 (six) hours as needed for wheezing or shortness of breath.  BMI 34.0-34.9,adult  Gastroesophageal reflux disease, unspecified whether esophagitis present  Hyperlipidemia, unspecified hyperlipidemia type  Other orders -     rosuvastatin (CRESTOR) 10 MG tablet; Take 1  tablet (10 mg total) by mouth 3 (three) times a week. -     famotidine (PEPCID) 40 MG tablet; Take 1 tablet (40 mg total) by mouth daily. -     valsartan-hydrochlorothiazide (DIOVAN-HCT) 80-12.5 MG tablet; Take 1 tablet by mouth daily. -     tirzepatide [facility]) 2.5 MG/0.5ML Pen; Inject 2.5 mg into the skin once a week. -     tirzepatide [facility]) 5 MG/0.5ML Pen; Inject 5 mg into the skin once a week. -     glucose blood (FREESTYLE TEST STRIPS) test strip; Use as directed to check blood glucose daily.    Follow up: 30mo

## 2023-04-03 NOTE — Patient Instructions (Signed)
 There are certain things that we call "standards of care" for all diabetics.  The goal diabetes care to prevent complications down the road.  Having diabetes at age 28 means you have a long time where things could potentially not go well.  Thus we want to do all we can to prevent problems down the road  This includes certain medications, routine follow-up visits, routine labs  For example, here are some general recommendations for diabetics: See your eye doctor yearly make sure they do a diabetic eye exam on you yearly.  Make sure they send a copy of the results to [place] as well See your dentist twice year for cleaning and hygiene visits Check your feet daily for sores or wounds.  If you notice anything not healing or a new wound in come in for evaluation.  Diabetics are more likely to get amputations or infections of the foot that can become really problematic You should be good about taking her medications every day We use cholesterol medicine for diabetics to prevent risk of heart disease and stroke and vascular disease We want to maintain a good blood pressure to avoid damage to the kidney and the heart.  Goal blood pressure is 120/70 or less Blood sugar goal is to be less than 130 fasting or before meal.  Hemoglobin A1c goal is less than 7% Exercise most days per week Your 5 4 3-1 diet plan sounds good including 5 vegetables daily, 4 fruits daily 3 or less meat servings daily. Limit or avoid junk food, large carbohydrate meals such as large quantities of pasta and bread and rice, avoid sugary drinks such as sweet tea, juice, and soda  I recommend you start rosuvastatin Crestor 10 mg 3 days/week, Monday Wednesday Friday for preventing heart disease and stroke and to lower your cholesterol  I recommend you take the whole tablet of your valsartan HCT 80/12.5 mg every day in the morning  I recommend you start the [facility] weekly injection to help lower your sugars and to help with weight loss.   Start out 2.5 mg weekly for the first month.  Then increase to 5 mg weekly.  If this medicine is too expensive then call your insurance to see what options would be cheaper.  Examples include Ozempic, Rybelsus, Trulicity, [employer]

## 2023-05-29 ENCOUNTER — Other Ambulatory Visit: Payer: Self-pay | Admitting: Medical

## 2023-06-26 ENCOUNTER — Telehealth: Payer: Self-pay

## 2023-06-26 MED ORDER — EQ BLOOD GLUCOSE TEST VI STRP: ORAL_STRIP | 1 refills | Status: AC

## 2023-06-26 NOTE — Telephone Encounter (Signed)
 Sent to local pharmacy.

## 2023-07-04 ENCOUNTER — Ambulatory Visit: Payer: BC Managed Care – PPO | Admitting: Medical

## 2023-07-07 ENCOUNTER — Ambulatory Visit
Admission: EM | Admit: 2023-07-07 | Discharge: 2023-07-07 | Disposition: A | Discharge status: Home / Self Care | Attending: Emergency Medicine | Admitting: Emergency Medicine

## 2023-07-07 DIAGNOSIS — J069 Acute upper respiratory infection, unspecified: Secondary | ICD-10-CM | POA: Diagnosis not present

## 2023-07-07 DIAGNOSIS — J4541 Moderate persistent asthma with (acute) exacerbation: Secondary | ICD-10-CM | POA: Diagnosis not present

## 2023-07-07 LAB — POC COVID19/FLU A&B COMBO
Covid Antigen, POC: NEGATIVE
Influenza A Antigen, POC: NEGATIVE
Influenza B Antigen, POC: NEGATIVE

## 2023-07-07 MED ORDER — AZITHROMYCIN 250 MG PO TABS: 250.0000 mg | ORAL_TABLET | Freq: Every day | ORAL | 0 refills | Status: DC

## 2023-07-07 MED ORDER — PREDNISONE 10 MG PO TABS
40.0000 mg | ORAL_TABLET | Freq: Every day | ORAL | 0 refills | Status: AC
Start: 2023-07-07 — End: 2023-07-12

## 2023-07-07 NOTE — ED Triage Notes (Signed)
 Patient to Urgent Care with complaints of productive cough/ fevers (max 102.6)/ body aches/ nausea/ fatigue/ lightheaded.   Symptoms started Tuesday.   Meds: tylenol  extra strength/ zyrtec 

## 2023-07-07 NOTE — ED Provider Notes (Signed)
 [name]    CSN: [id number] Arrival date & time: 07/07/23  1640      History   Chief Complaint Chief Complaint  Patient presents with   Nausea   Cough   Fever    HPI Daniel Wood is a 28 y.o. male.  Accompanied by his wife, patient presents with 4-day history of fever, body aches, fatigue, congestion, postnasal drip, cough, nausea.  He took Tylenol  at noon today.  He also has taken Zyrtec .  He has been using his albuterol  inhaler.  He denies shortness of breath, vomiting, diarrhea.  Patient states he has not been using his maintenance inhaler (Qvar ).  His medical history includes asthma, hypertension, diabetes.  The history is provided by the patient, the spouse and medical records.    Past Medical History:  Diagnosis Date   Allergy    Asthma    Diabetes mellitus without complication (HCC) 2022    Patient Active Problem List   Diagnosis Date Noted   Hyperlipidemia 04/03/2023   Type II diabetes mellitus with complication (HCC) 11/10/2021   BMI 34.0-34.9,adult 11/10/2021   Influenza vaccination declined 11/10/2021   Essential hypertension, benign 06/29/2021   Muscle spasm 06/29/2021   Gastroesophageal reflux disease 12/27/2019   Extrinsic asthma 06/14/2018   Allergic rhinitis due to pollen 06/14/2018   Routine general medical examination at a health care facility 04/04/2017    Past Surgical History:  Procedure Laterality Date   NO PAST SURGERIES  06/25/2022   WISDOM TOOTH EXTRACTION         Home Medications    Prior to Admission medications   Medication Sig Start Date End Date Taking? Authorizing Provider  azithromycin  (ZITHROMAX ) 250 MG tablet Take 1 tablet (250 mg total) by mouth daily. Take first 2 tablets together, then 1 every day until finished. 07/07/23  Yes Wellington Half, NP  predniSONE  (DELTASONE ) 10 MG tablet Take 4 tablets (40 mg total) by mouth daily for 5 days. 07/07/23 07/12/23 Yes Wellington Half, NP  albuterol  (VENTOLIN  HFA) 108 (90  Base) MCG/ACT inhaler Inhale 1-2 puffs into the lungs every 6 (six) hours as needed for wheezing or shortness of breath. 04/03/23   Tysinger, Christiane Cowing, PA-C  beclomethasone (QVAR  REDIHALER) 80 MCG/ACT inhaler Inhale 2 puffs into the lungs 2 (two) times daily. 04/03/23   Tysinger, Christiane Cowing, PA-C  Blood Glucose Monitoring Suppl (FREESTYLE LITE) w/Device KIT Use as directed to check blood glucose daily. 08/04/21   Tysinger, Christiane Cowing, PA-C  famotidine  (PEPCID ) 40 MG tablet Take 1 tablet (40 mg total) by mouth daily. 04/03/23   Tysinger, Christiane Cowing, PA-C  glucose blood [facility] BLOOD GLUCOSE TEST) test strip Test once daily 06/26/23   Tysinger, Christiane Cowing, PA-C  Lancets (FREESTYLE) lancets Use as directed to check blood glucose levels daily. 08/04/21   Tysinger, Christiane Cowing, PA-C  rosuvastatin  (CRESTOR ) 10 MG tablet Take 1 tablet (10 mg total) by mouth 3 (three) times a week. 04/03/23 04/02/24  Tysinger, Christiane Cowing, PA-C  tirzepatide  (MOUNJARO ) 2.5 MG/0.5ML Pen Inject 2.5 mg into the skin once a week. 04/03/23   Tysinger, Christiane Cowing, PA-C  tirzepatide  (MOUNJARO ) 5 MG/0.5ML Pen Inject 5 mg into the skin once a week. 04/03/23   Tysinger, Christiane Cowing, PA-C  valsartan -hydrochlorothiazide  (DIOVAN -HCT) 80-12.5 MG tablet Take 1 tablet by mouth daily. 04/03/23   Tysinger, Christiane Cowing, PA-C    Family History Family History  Problem Relation Age of Onset   Asthma Sister    Diabetes  Maternal Grandmother    Hypertension Maternal Grandmother    Heart disease Neg Hx    Stroke Neg Hx    Cancer Neg Hx     Social History Social History   Tobacco Use   Smoking status: Never   Smokeless tobacco: Never  Vaping Use   Vaping status: Former  Substance Use Topics   Alcohol use: Yes    Comment: occ   Drug use: No     Allergies   Patient has no known allergies.   Review of Systems Review of Systems  Constitutional:  Positive for fatigue and fever. Negative for chills.  HENT:  Positive for congestion, postnasal drip and rhinorrhea. Negative for  ear pain and sore throat.   Respiratory:  Positive for cough. Negative for shortness of breath.   Gastrointestinal:  Positive for nausea. Negative for diarrhea and vomiting.     Physical Exam Triage Vital Signs ED Triage Vitals  Encounter Vitals Group     BP 07/07/23 1650 104/66     Systolic BP Percentile --      Diastolic BP Percentile --      Pulse Rate 07/07/23 1650 98     Resp 07/07/23 1650 20     Temp 07/07/23 1650 99 F (37.2 C)     Temp src --      SpO2 07/07/23 1650 95 %     Weight --      Height --      Head Circumference --      Peak Flow --      Pain Score 07/07/23 1649 3     Pain Loc --      Pain Education --      Exclude from Growth Chart --    No data found.  Updated Vital Signs BP 104/66   Pulse 98   Temp 99 F (37.2 C)   Resp 20   SpO2 95%   Visual Acuity Right Eye Distance:   Left Eye Distance:   Bilateral Distance:    Right Eye Near:   Left Eye Near:    Bilateral Near:     Physical Exam Constitutional:      General: He is not in acute distress. HENT:     Right Ear: Tympanic membrane normal.     Left Ear: Tympanic membrane normal.     Nose: Congestion and rhinorrhea present.     Mouth/Throat:     Mouth: Mucous membranes are moist.     Pharynx: Oropharynx is clear.  Cardiovascular:     Rate and Rhythm: Normal rate and regular rhythm.     Heart sounds: Normal heart sounds.  Pulmonary:     Effort: Pulmonary effort is normal. No respiratory distress.     Breath sounds: Normal breath sounds. No wheezing.  Neurological:     Mental Status: He is alert.      UC Treatments / Results  Labs (all labs ordered are listed, but only abnormal results are displayed) Labs Reviewed  POC COVID19/FLU A&B COMBO - Normal    EKG   Radiology No results found.  Procedures Procedures (including critical care time)  Medications Ordered in UC Medications - No data to display  Initial Impression / Assessment and Plan / UC Course  I have  reviewed the triage vital signs and the nursing notes.  Pertinent labs & imaging results that were available during my care of the patient were reviewed by me and considered in my medical decision making (see  chart for details).    Asthma exacerbation, acute upper respiratory infection.  No respiratory distress.  O2 sat 95% on room air.  COVID and flu negative.  Treating today with Zithromax and prednisone.  Instructed patient to continue using his albuterol  inhaler.  Also discussed that he should start using his maintenance inhaler (Qvar ) as he is not currently using this.  Instructed him to follow-up with his PCP on Monday.  ED precautions discussed.  Education provided on asthma and upper respiratory infection.  He agrees to plan of care.  Final Clinical Impressions(s) / UC Diagnoses   Final diagnoses:  Moderate persistent asthma with acute exacerbation  Acute upper respiratory infection     Discharge Instructions      Follow up with your primary care provider on Monday.  Go to the emergency department if you have worsening symptoms.    Take the Zithromax and prednisone as directed.  Continue to use your albuterol  inhaler as directed.  Start using your maintenance inhaler also.       ED Prescriptions     Medication Sig Dispense Auth. Provider   predniSONE (DELTASONE) 10 MG tablet Take 4 tablets (40 mg total) by mouth daily for 5 days. 20 tablet Wellington Half, NP   azithromycin (ZITHROMAX) 250 MG tablet Take 1 tablet (250 mg total) by mouth daily. Take first 2 tablets together, then 1 every day until finished. 6 tablet Wellington Half, NP      PDMP not reviewed this encounter.   Wellington Half, NP 07/07/23 1728

## 2023-07-07 NOTE — Discharge Instructions (Addendum)
 Follow up with your primary care provider on Monday.  Go to the emergency department if you have worsening symptoms.    Take the Zithromax and prednisone as directed.  Continue to use your albuterol  inhaler as directed.  Start using your maintenance inhaler also.

## 2023-10-25 ENCOUNTER — Other Ambulatory Visit (HOSPITAL_COMMUNITY): Payer: Self-pay

## 2023-11-13 ENCOUNTER — Encounter: Payer: BC Managed Care – PPO | Admitting: Medical

## 2023-11-14 ENCOUNTER — Ambulatory Visit (INDEPENDENT_AMBULATORY_CARE_PROVIDER_SITE_OTHER): Payer: BC Managed Care – PPO | Admitting: Medical

## 2023-11-14 ENCOUNTER — Encounter: Payer: Self-pay | Admitting: Medical

## 2023-11-14 VITALS — BP 132/88 | HR 63 | Ht 70.5 in | Wt 241.8 lb

## 2023-11-14 DIAGNOSIS — E118 Type 2 diabetes mellitus with unspecified complications: Secondary | ICD-10-CM | POA: Diagnosis not present

## 2023-11-14 DIAGNOSIS — Z0000 Encounter for general adult medical examination without abnormal findings: Secondary | ICD-10-CM

## 2023-11-14 DIAGNOSIS — K219 Gastro-esophageal reflux disease without esophagitis: Secondary | ICD-10-CM

## 2023-11-14 DIAGNOSIS — Z6834 Body mass index (BMI) 34.0-34.9, adult: Secondary | ICD-10-CM

## 2023-11-14 DIAGNOSIS — R41840 Attention and concentration deficit: Secondary | ICD-10-CM

## 2023-11-14 DIAGNOSIS — E785 Hyperlipidemia, unspecified: Secondary | ICD-10-CM

## 2023-11-14 DIAGNOSIS — I10 Essential (primary) hypertension: Secondary | ICD-10-CM | POA: Diagnosis not present

## 2023-11-14 DIAGNOSIS — Z2821 Immunization not carried out because of patient refusal: Secondary | ICD-10-CM

## 2023-11-14 DIAGNOSIS — J4540 Moderate persistent asthma, uncomplicated: Secondary | ICD-10-CM

## 2023-11-14 NOTE — Patient Instructions (Addendum)
 Referral placed today for behavioral health for consult regarding a concern for attention deficit  Get your diabetic eye exam soon from an eye doctor and make sure they send us  a copy of the record    Separate significant issues discussed: Type 2 diabetes currently self managed with supplements.  discussed long-term risks, including increased risk for kidney disease due to ethnicity, risk for heart disease, kidney disease, neuropahty, other - Monitor A1c and blood glucose levels. - Discuss maintaining A1c below 7 to reduce complications -due for yearly diabetic eye exam.  Essential hypertension Hypertension managed with valsartan /HCT 80/12.5mg  daily - Encourage regular home blood pressure monitoring and consider replacing the blood pressure monitor.  Dyslipidemia Dyslipidemia with no current medication. Previously prescribed but not started. Discussed importance of management in reducing cardiovascular risk, especially with diabetes. - Discuss starting a cholesterol-lowering medication. - Reinforce importance of cholesterol management in diabetes care.  Attention deficit  Concerns for adult ADD based on questionnaire. No prior psychiatric consultation. Family history of ADD. - Recommend consultation with a counselor or psychiatrist for further evaluation and management of ADD. -referral placed  Asthma -Continue current regimen, Qvar  preventative inhaler, albuterol  for rescue inhaler    This visit was a preventative care visit, also known as wellness visit or routine physical.   Topics typically include healthy lifestyle, diet, exercise, preventative care, vaccinations, sick and well care, proper use of emergency dept and after hours care, as well as other concerns.      General Recommendations: Continue to return yearly for your annual wellness and preventative care visits.  This gives us  a chance to discuss healthy lifestyle, exercise, vaccinations, review your chart record, and  perform screenings where appropriate.  I recommend you see your eye doctor yearly for routine vision care.  I recommend you see your dentist yearly for routine dental care including hygiene visits twice yearly.   Vaccination  Immunization History  Administered Date(s) Administered   DTaP 07/17/1996, 11/26/1996, 08/28/1997, 07/08/1998, 07/31/2001   HIB (PRP-OMP) 07/17/1996, 11/26/1996, 11/17/1997, 07/08/1998   Hepatitis A 07/06/2007, 05/31/2010   Hepatitis B [birth year]96, 07/17/1996, 08/28/1997   IPV 07/17/1996, 11/26/1996, 08/28/1997, 07/31/2001   MMR 08/28/1997, 07/31/2001   MenQuadfi_Meningococcal Groups ACYW Conjugate 07/06/2007   Meningococcal polysaccharide vaccine (MPSV4) 07/06/2007   PFIZER(Purple Top)SARS-COV-2 Vaccination 10/17/2019, 11/07/2019   Pneumococcal-Unspecified 12/18/1998   Td 07/06/2007, 06/14/2018   Varicella 11/17/1997, 05/31/2010    Vaccine recommendations: flu  Vaccines administered today: Declines vaccines   Screening for cancer: Colon cancer screening: Age 52  Testicular cancer screening You should do a monthly self testicular exam if you are between 83-12 years old, and we typically do a testicular exam on the yearly physical for this same age group.   Prostate Cancer screening: The recommended prostate cancer screening test is a blood test called the prostate-specific antigen (PSA) test. PSA is a protein that is made in the prostate. As you age, your prostate naturally produces more PSA. Abnormally high PSA levels may be caused by: Prostate cancer. An enlarged prostate that is not caused by cancer (benign prostatic hyperplasia, or BPH). This condition is very common in older men. A prostate gland infection (prostatitis) or urinary tract infection. Certain medicines such as male hormones (like testosterone) or other medicines that raise testosterone levels. A rectal exam may be done as part of prostate cancer screening to help provide information  about the size of your prostate gland. When a rectal exam is performed, it should be done after the PSA level is  drawn to avoid any effect on the results.   Skin cancer screening: Check your skin regularly for new changes, growing lesions, or other lesions of concern Come in for evaluation if you have skin lesions of concern.   Lung cancer screening: If you have a greater than 20 pack year history of tobacco use, then you may qualify for lung cancer screening with a chest CT scan.   Please call your insurance company to inquire about coverage for this test.   Pancreatic cancer:  no current screening test is available or routinely recommended. (risk factors: smoking, overweight or obese, diabetes, chronic pancreatitis, work exposure - dry cleaning, metal working, 28yo>, M>F, Tree surgeon, family hx/o, hereditary breast, ovarian, melanoma, lynch, peutz-jeghers).  Symptoms: jaundice, dark urine, light color or greasy stools, itchy skin, belly or back pain, weight loss, poor appetite, nausea, vomiting, liver enlargement, DVT/blood clots.   We currently don't have screenings for other cancers besides breast, cervical, colon, and lung cancers.  If you have a strong family history of cancer or have other cancer screening concerns, please let me know.  Genetic testing referral is an option for individuals with high cancer risk in the family.  There are some other cancer screenings in development currently.   Bone health: Get at least 150 minutes of aerobic exercise weekly Get weight bearing exercise at least once weekly Bone density test:  A bone density test is an imaging test that uses a type of X-ray to measure the amount of calcium  and other minerals in your bones. The test may be used to diagnose or screen you for a condition that causes weak or thin bones (osteoporosis), predict your risk for a broken bone (fracture), or determine how well your osteoporosis treatment is working. The bone  density test is recommended for females 65 and older, or females or males <65 if certain risk factors such as thyroid  disease, long term use of steroids such as for asthma or rheumatological issues, vitamin D deficiency, estrogen deficiency, family history of osteoporosis, self or family history of fragility fracture in first degree relative.    Heart health: Get at least 150 minutes of aerobic exercise weekly Limit alcohol It is important to maintain a healthy blood pressure and healthy cholesterol numbers  Heart disease screening: Screening for heart disease includes screening for blood pressure, fasting lipids, glucose/diabetes screening, BMI height to weight ratio, reviewed of smoking status, physical activity, and diet.    Goals include blood pressure 120/80 or less, maintaining a healthy lipid/cholesterol profile, preventing diabetes or keeping diabetes numbers under good control, not smoking or using tobacco products, exercising most days per week or at least 150 minutes per week of exercise, and eating healthy variety of fruits and vegetables, healthy oils, and avoiding unhealthy food choices like fried food, fast food, high sugar and high cholesterol foods.    Other tests may possibly include EKG test, CT coronary calcium  score, echocardiogram, exercise treadmill stress test.       Vascular disease screening: For higher risk individuals including smokers, diabetics, patients with known heart disease or high blood pressure, kidney disease, and others, screening for vascular disease or atherosclerosis of the arteries is available.  Examples may include carotid ultrasound, abdominal aortic ultrasound, ABI blood flow screening in the legs, thoracic aorta screening.   Medical care options: I recommend you continue to seek care here first for routine care.  We try really hard to have available appointments Monday through Friday daytime hours for sick visits,  acute visits, and physicals.   Urgent care should be used for after hours and weekends for significant issues that cannot wait till the next day.  The emergency department should be used for significant potentially life-threatening emergencies.  The emergency department is expensive, can often have long wait times for less significant concerns, so try to utilize primary care, urgent care, or telemedicine when possible to avoid unnecessary trips to the emergency department.  Virtual visits and telemedicine have been introduced since the pandemic started in 2020, and can be convenient ways to receive medical care.  We offer virtual appointments as well to assist you in a variety of options to seek medical care.   Legal  Take the time to do a last will and testament, Advanced Directives including Health Care Power of Attorney and Living Will documents.  Don't leave your family with burdens that can be handled ahead of time.   Advanced Directives: I recommend you consider completing a Health Care Power of Attorney and Living Will.   These documents respect your wishes and help alleviate burdens on your loved ones if you were to become terminally ill or be in a position to need those documents enforced.    You can complete Advanced Directives yourself, have them notarized, then have copies made for our office, for you and for anybody you feel should have them in safe keeping.  Or, you can have an attorney prepare these documents.   If you haven't updated your Last Will and Testament in a while, it may be worthwhile having an attorney prepare these documents together and save on some costs.

## 2023-11-14 NOTE — Progress Notes (Signed)
 Name: Daniel Wood   Date of Visit: 11/14/23   Date of last visit with me: 07/04/2023   CHIEF COMPLAINT:  Chief Complaint  Patient presents with   Annual Exam    Fasting cpe, no concerns. Declines flu and covid, pneumonia. Would like a referral to pysch due to ADHD       HPI:  Discussed the use of AI scribe software for clinical note transcription with the patient, who gave verbal consent to proceed.  History of Present Illness   Daniel Wood is a 28 year old male who presents for an annual physical exam.  He has a history of hypertension and has been out of his blood pressure medication, valsartan  HCT 80/12.5 mg, for a few days. He does not regularly check his blood pressure at home due to a malfunctioning monitor.  Regarding his diabetes, he is not on any prescribed medication but takes over-the-counter supplements such as chromium and cinnamon. He occasionally checks his blood sugar, with recent readings between 124 and 142 mg/dL. He recalls a previous reading of 180 mg/dL when he felt 'funny'. His last A1c was 7.2%.  He has concerns about potential ADHD, recognizing issues in daily life and has completed a questionnaire. His sister, who also has ADHD, has shared information with him about the condition. He has not pursued further evaluation or treatment.  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 He also does [employer] on the side. He has not been exercising outside of work recently but has previously attended a gym.  Socially, he quit drinking alcohol in August and has never smoked. He is working on making lifestyle changes to improve his health.        Reviewed their medical, surgical, family, social, medication, and allergy history and updated chart as appropriate.  No Known Allergies  Past Medical History:  Diagnosis Date    Allergy    Asthma    Diabetes mellitus without complication (HCC) 2022     Current Outpatient Medications:    albuterol  (VENTOLIN  HFA) 108 (90 Base) MCG/ACT inhaler, Inhale 1-2 puffs into the lungs every 6 (six) hours as needed for wheezing or shortness of breath., Disp: 18 g, Rfl: 1   beclomethasone (QVAR  REDIHALER) 80 MCG/ACT inhaler, Inhale 2 puffs into the lungs 2 (two) times daily., Disp: 10.6 g, Rfl: 2   famotidine  (PEPCID ) 40 MG tablet, Take 1 tablet (40 mg total) by mouth daily., Disp: 90 tablet, Rfl: 2   valsartan -hydrochlorothiazide  (DIOVAN -HCT) 80-12.5 MG tablet, Take 1 tablet by mouth daily., Disp: 90 tablet, Rfl: 2   Blood Glucose Monitoring Suppl (FREESTYLE LITE) w/Device KIT, Use as directed to check blood glucose daily., Disp: 1 kit, Rfl: 0   glucose blood (EQ BLOOD GLUCOSE TEST) test strip, Test once daily, Disp: 100 strip, Rfl: 1   Lancets (FREESTYLE) lancets, Use as directed to check blood glucose levels daily., Disp: 100 each, Rfl: 3  Family History  Problem Relation Age of Onset   Asthma Sister    Diabetes Maternal Grandmother    Hypertension Maternal Grandmother    Heart disease Neg Hx    Stroke Neg Hx    Cancer Neg Hx     Past Surgical History:  Procedure Laterality Date   NO PAST SURGERIES  06/25/2022   WISDOM TOOTH EXTRACTION       Review  of Systems  Constitutional:  Negative for chills, fever, malaise/fatigue and weight loss.  HENT:  Negative for congestion, ear pain, hearing loss, sore throat and tinnitus.   Eyes:  Negative for blurred vision, pain and redness.  Respiratory:  Negative for cough, hemoptysis and shortness of breath.   Cardiovascular:  Negative for chest pain, palpitations, orthopnea, claudication and leg swelling.  Gastrointestinal:  Negative for abdominal pain, blood in stool, constipation, diarrhea, nausea and vomiting.  Genitourinary:  Negative for dysuria, flank pain, frequency, hematuria and urgency.  Musculoskeletal:  Negative  for falls, joint pain and myalgias.  Skin:  Negative for itching and rash.  Neurological:  Negative for dizziness, tingling, speech change, weakness and headaches.  Endo/Heme/Allergies:  Negative for polydipsia. Does not bruise/bleed easily.  Psychiatric/Behavioral:  Negative for depression and memory loss. The patient is not nervous/anxious and does not have insomnia.      OBJECTIVE:    BP 132/88   Pulse 63   Ht 5' 10.5 (1.791 m)   Wt 241 lb 12.8 oz (109.7 kg)   BMI 34.20 kg/m   BP Readings from Last 3 Encounters:  11/14/23 132/88  07/07/23 104/66  04/03/23 130/80    Wt Readings from Last 3 Encounters:  11/14/23 241 lb 12.8 oz (109.7 kg)  04/03/23 243 lb 6.4 oz (110.4 kg)  11/08/22 244 lb 12.8 oz (111 kg)    Physical Exam   General appearance: alert, no distress, WD/WN, African American male Skin: unremarkable, tattoos of bilateral forearms, tattoo right anterior thigh HEENT: normocephalic, conjunctiva/corneas normal, sclerae anicteric, PERRLA, EOMi, nares patent, no discharge or erythema, pharynx normal Oral cavity: MMM, tongue normal, teeth normal Neck: supple, no lymphadenopathy, no thyromegaly, no masses, normal ROM, no bruits Chest: non tender, normal shape and expansion Heart: RRR, normal S1, S2, no murmurs Lungs: CTA bilaterally, no wheezes, rhonchi, or rales Abdomen: +bs, soft, non tender, non distended, no masses, no hepatomegaly, no splenomegaly, no bruits Back: non tender, normal ROM, no scoliosis Musculoskeletal: upper extremities non tender, no obvious deformity, normal ROM throughout, lower extremities non tender, no obvious deformity, normal ROM throughout Extremities: no edema, no cyanosis, no clubbing Pulses: 2+ symmetric, upper and lower extremities, normal cap refill Neurological: alert, oriented x 3, CN2-12 intact, strength normal upper extremities and lower extremities, sensation normal throughout, DTRs 2+ throughout, no cerebellar signs, gait  normal Psychiatric: normal affect, behavior normal, pleasant  GU: normal male external genitalia,circumcised, nontender, no masses, no hernia, no lymphadenopathy Rectal: deferred  Diabetic Foot Exam - Simple   Simple Foot Form Diabetic Foot exam was performed with the following findings: Yes 11/14/2023  9:53 AM  Visual Inspection No deformities, no ulcerations, no other skin breakdown bilaterally: Yes Sensation Testing Intact to touch and monofilament testing bilaterally: Yes Pulse Check Posterior Tibialis and Dorsalis pulse intact bilaterally: Yes Comments       ASSESSMENT/PLAN:   Encounter Diagnoses  Name Primary?   Routine general medical examination at a health care facility Yes   Type II diabetes mellitus with complication (HCC)    Hyperlipidemia, unspecified hyperlipidemia type    BMI 34.0-34.9,adult    Essential hypertension, benign    Moderate persistent extrinsic asthma without complication    Gastroesophageal reflux disease, unspecified whether esophagitis present    Influenza vaccination declined    Attention deficit     Separate significant issues discussed: Type 2 diabetes currently self managed with supplements.  discussed long-term risks, including increased risk for kidney disease due to ethnicity, risk for heart  disease, kidney disease, neuropahty, other - Monitor A1c and blood glucose levels. - Discuss maintaining A1c below 7 to reduce complications -due for yearly diabetic eye exam.  Essential hypertension Hypertension managed with valsartan /HCT 80/12.5mg  daily - Encourage regular home blood pressure monitoring and consider replacing the blood pressure monitor.  Dyslipidemia Dyslipidemia with no current medication. Previously prescribed but not started. Discussed importance of management in reducing cardiovascular risk, especially with diabetes. - Discuss starting a cholesterol-lowering medication. - Reinforce importance of cholesterol management in  diabetes care.  Attention deficit  Concerns for adult ADD based on questionnaire. No prior psychiatric consultation. Family history of ADD. - Recommend consultation with a counselor or psychiatrist for further evaluation and management of ADD. -referral placed  Asthma -Continue current regimen, Qvar  preventative inhaler, albuterol  for rescue inhaler    This visit was a preventative care visit, also known as wellness visit or routine physical.   Topics typically include healthy lifestyle, diet, exercise, preventative care, vaccinations, sick and well care, proper use of emergency dept and after hours care, as well as other concerns.      General Recommendations: Continue to return yearly for your annual wellness and preventative care visits.  This gives us  a chance to discuss healthy lifestyle, exercise, vaccinations, review your chart record, and perform screenings where appropriate.  I recommend you see your eye doctor yearly for routine vision care.  I recommend you see your dentist yearly for routine dental care including hygiene visits twice yearly.   Vaccination  Immunization History  Administered Date(s) Administered   DTaP 07/17/1996, 11/26/1996, 08/28/1997, 07/08/1998, 07/31/2001   HIB (PRP-OMP) 07/17/1996, 11/26/1996, 11/17/1997, 07/08/1998   Hepatitis A 07/06/2007, 05/31/2010   Hepatitis B 04/24/96, 07/17/1996, 08/28/1997   IPV 07/17/1996, 11/26/1996, 08/28/1997, 07/31/2001   MMR 08/28/1997, 07/31/2001   MenQuadfi_Meningococcal Groups ACYW Conjugate 07/06/2007   Meningococcal polysaccharide vaccine (MPSV4) 07/06/2007   PFIZER(Purple Top)SARS-COV-2 Vaccination 10/17/2019, 11/07/2019   Pneumococcal-Unspecified 12/18/1998   Td 07/06/2007, 06/14/2018   Varicella 11/17/1997, 05/31/2010    Vaccine recommendations: flu  Vaccines administered today: Declines vaccines   Screening for cancer: Colon cancer screening: Age 3  Testicular cancer screening You should  do a monthly self testicular exam if you are between 61-8 years old, and we typically do a testicular exam on the yearly physical for this same age group.   Prostate Cancer screening: The recommended prostate cancer screening test is a blood test called the prostate-specific antigen (PSA) test. PSA is a protein that is made in the prostate. As you age, your prostate naturally produces more PSA. Abnormally high PSA levels may be caused by: Prostate cancer. An enlarged prostate that is not caused by cancer (benign prostatic hyperplasia, or BPH). This condition is very common in older men. A prostate gland infection (prostatitis) or urinary tract infection. Certain medicines such as male hormones (like testosterone) or other medicines that raise testosterone levels. A rectal exam may be done as part of prostate cancer screening to help provide information about the size of your prostate gland. When a rectal exam is performed, it should be done after the PSA level is drawn to avoid any effect on the results.   Skin cancer screening: Check your skin regularly for new changes, growing lesions, or other lesions of concern Come in for evaluation if you have skin lesions of concern.   Lung cancer screening: If you have a greater than 20 pack year history of tobacco use, then you may qualify for lung cancer screening with a chest  CT scan.   Please call your insurance company to inquire about coverage for this test.   Pancreatic cancer:  no current screening test is available or routinely recommended. (risk factors: smoking, overweight or obese, diabetes, chronic pancreatitis, work exposure - dry cleaning, metal working, 28yo>, M>F, Tree surgeon, family hx/o, hereditary breast, ovarian, melanoma, lynch, peutz-jeghers).  Symptoms: jaundice, dark urine, light color or greasy stools, itchy skin, belly or back pain, weight loss, poor appetite, nausea, vomiting, liver enlargement, DVT/blood clots.   We  currently don't have screenings for other cancers besides breast, cervical, colon, and lung cancers.  If you have a strong family history of cancer or have other cancer screening concerns, please let me know.  Genetic testing referral is an option for individuals with high cancer risk in the family.  There are some other cancer screenings in development currently.   Bone health: Get at least 150 minutes of aerobic exercise weekly Get weight bearing exercise at least once weekly Bone density test:  A bone density test is an imaging test that uses a type of X-ray to measure the amount of calcium  and other minerals in your bones. The test may be used to diagnose or screen you for a condition that causes weak or thin bones (osteoporosis), predict your risk for a broken bone (fracture), or determine how well your osteoporosis treatment is working. The bone density test is recommended for females 65 and older, or females or males <65 if certain risk factors such as thyroid  disease, long term use of steroids such as for asthma or rheumatological issues, vitamin D deficiency, estrogen deficiency, family history of osteoporosis, self or family history of fragility fracture in first degree relative.    Heart health: Get at least 150 minutes of aerobic exercise weekly Limit alcohol It is important to maintain a healthy blood pressure and healthy cholesterol numbers  Heart disease screening: Screening for heart disease includes screening for blood pressure, fasting lipids, glucose/diabetes screening, BMI height to weight ratio, reviewed of smoking status, physical activity, and diet.    Goals include blood pressure 120/80 or less, maintaining a healthy lipid/cholesterol profile, preventing diabetes or keeping diabetes numbers under good control, not smoking or using tobacco products, exercising most days per week or at least 150 minutes per week of exercise, and eating healthy variety of fruits and  vegetables, healthy oils, and avoiding unhealthy food choices like fried food, fast food, high sugar and high cholesterol foods.    Other tests may possibly include EKG test, CT coronary calcium  score, echocardiogram, exercise treadmill stress test.       Vascular disease screening: For higher risk individuals including smokers, diabetics, patients with known heart disease or high blood pressure, kidney disease, and others, screening for vascular disease or atherosclerosis of the arteries is available.  Examples may include carotid ultrasound, abdominal aortic ultrasound, ABI blood flow screening in the legs, thoracic aorta screening.   Medical care options: I recommend you continue to seek care here first for routine care.  We try really hard to have available appointments Monday through Friday daytime hours for sick visits, acute visits, and physicals.  Urgent care should be used for after hours and weekends for significant issues that cannot wait till the next day.  The emergency department should be used for significant potentially life-threatening emergencies.  The emergency department is expensive, can often have long wait times for less significant concerns, so try to utilize primary care, urgent care, or telemedicine when possible to  avoid unnecessary trips to the emergency department.  Virtual visits and telemedicine have been introduced since the pandemic started in 2020, and can be convenient ways to receive medical care.  We offer virtual appointments as well to assist you in a variety of options to seek medical care.   Legal  Take the time to do a last will and testament, Advanced Directives including Health Care Power of Attorney and Living Will documents.  Don't leave your family with burdens that can be handled ahead of time.   Advanced Directives: I recommend you consider completing a Health Care Power of Attorney and Living Will.   These documents respect your wishes and help  alleviate burdens on your loved ones if you were to become terminally ill or be in a position to need those documents enforced.    You can complete Advanced Directives yourself, have them notarized, then have copies made for our office, for you and for anybody you feel should have them in safe keeping.  Or, you can have an attorney prepare these documents.   If you haven't updated your Last Will and Testament in a while, it may be worthwhile having an attorney prepare these documents together and save on some costs.       Daniel Wood was seen today for annual exam.  Diagnoses and all orders for this visit:  Routine general medical examination at a health care facility -     CBC -     Comprehensive metabolic panel with GFR -     Lipid panel -     TSH -     Hemoglobin A1c -     Microalbumin/Creatinine Ratio, Urine -     VITAMIN D 25 Hydroxy (Vit-D Deficiency, Fractures)  Type II diabetes mellitus with complication (HCC) -     Hemoglobin A1c -     Microalbumin/Creatinine Ratio, Urine  Hyperlipidemia, unspecified hyperlipidemia type -     Lipid panel  BMI 34.0-34.9,adult  Essential hypertension, benign -     Comprehensive metabolic panel with GFR  Moderate persistent extrinsic asthma without complication  Gastroesophageal reflux disease, unspecified whether esophagitis present  Influenza vaccination declined  Attention deficit -     TSH -     VITAMIN D 25 Hydroxy (Vit-D Deficiency, Fractures) -     Ambulatory referral to Psychology     I recommend follow up yearly for a routine physical.   [facility]

## 2023-11-15 ENCOUNTER — Other Ambulatory Visit: Payer: Self-pay | Admitting: Medical

## 2023-11-15 ENCOUNTER — Ambulatory Visit: Payer: Self-pay | Admitting: Medical

## 2023-11-15 DIAGNOSIS — J4540 Moderate persistent asthma, uncomplicated: Secondary | ICD-10-CM

## 2023-11-15 LAB — COMPREHENSIVE METABOLIC PANEL WITH GFR
ALT: 52 IU/L — ABNORMAL HIGH (ref 0–44)
AST: 30 IU/L (ref 0–40)
Albumin: 4.6 g/dL (ref 4.3–5.2)
Alkaline Phosphatase: 87 IU/L (ref 47–123)
BUN/Creatinine Ratio: 16 (ref 9–20)
BUN: 12 mg/dL (ref 6–20)
Bilirubin Total: 0.4 mg/dL (ref 0.0–1.2)
CO2: 18 mmol/L — ABNORMAL LOW (ref 20–29)
Calcium: 9.4 mg/dL (ref 8.7–10.2)
Chloride: 107 mmol/L — ABNORMAL HIGH (ref 96–106)
Creatinine, Ser: 0.76 mg/dL (ref 0.76–1.27)
Globulin, Total: 2.9 g/dL (ref 1.5–4.5)
Glucose: 139 mg/dL — ABNORMAL HIGH (ref 70–99)
Potassium: 4.6 mmol/L (ref 3.5–5.2)
Sodium: 137 mmol/L (ref 134–144)
Total Protein: 7.5 g/dL (ref 6.0–8.5)
eGFR: 126 mL/min/1.73 (ref >59)

## 2023-11-15 LAB — CBC
Hematocrit: 43.9 % (ref 37.5–51.0)
Hemoglobin: 14.5 g/dL (ref 13.0–17.7)
MCH: 28.5 pg (ref 26.6–33.0)
MCHC: 33 g/dL (ref 31.5–35.7)
MCV: 86 fL (ref 79–97)
Platelets: 236 x10E3/uL (ref 150–450)
RBC: 5.09 x10E6/uL (ref 4.14–5.80)
RDW: 12.9 % (ref 11.6–15.4)
WBC: 8.1 x10E3/uL (ref 3.4–10.8)

## 2023-11-15 LAB — HEMOGLOBIN A1C
Est. average glucose Bld gHb Est-mCnc: 163 mg/dL
Hgb A1c MFr Bld: 7.3 % — ABNORMAL HIGH (ref 4.8–5.6)

## 2023-11-15 LAB — LIPID PANEL
Chol/HDL Ratio: 5.1 ratio — ABNORMAL HIGH (ref 0.0–5.0)
Cholesterol, Total: 209 mg/dL — ABNORMAL HIGH (ref 100–199)
HDL: 41 mg/dL (ref >39)
LDL Chol Calc (NIH): 143 mg/dL — ABNORMAL HIGH (ref 0–99)
Triglycerides: 136 mg/dL (ref 0–149)
VLDL Cholesterol Cal: 25 mg/dL (ref 5–40)

## 2023-11-15 LAB — VITAMIN D 25 HYDROXY (VIT D DEFICIENCY, FRACTURES): Vit D, 25-Hydroxy: 17.1 ng/mL — AB (ref 30.0–100.0)

## 2023-11-15 LAB — MICROALBUMIN / CREATININE URINE RATIO
Creatinine, Urine: 47.5 mg/dL
Microalb/Creat Ratio: <6 mg/g{creat} (ref 0–29)
Microalbumin, Urine: <3 ug/mL

## 2023-11-15 LAB — TSH: TSH: 0.75 u[IU]/mL (ref 0.450–4.500)

## 2023-11-15 MED ORDER — ALBUTEROL SULFATE HFA 108 (90 BASE) MCG/ACT IN AERS: 1.0000 | INHALATION_SPRAY | Freq: Four times a day (QID) | RESPIRATORY_TRACT | 1 refills | Status: DC | PRN

## 2023-11-15 MED ORDER — METFORMIN HCL ER 500 MG PO TB24: 500.0000 mg | ORAL_TABLET | Freq: Every day | ORAL | 1 refills | Status: DC

## 2023-11-15 MED ORDER — ROSUVASTATIN CALCIUM 10 MG PO TABS: 10.0000 mg | ORAL_TABLET | Freq: Every day | ORAL | 3 refills | Status: DC

## 2023-11-15 MED ORDER — QVAR REDIHALER 80 MCG/ACT IN AERB: 2.0000 | INHALATION_SPRAY | Freq: Two times a day (BID) | RESPIRATORY_TRACT | 5 refills | Status: AC

## 2023-11-15 MED ORDER — VALSARTAN-HYDROCHLOROTHIAZIDE 80-12.5 MG PO TABS: 1.0000 | ORAL_TABLET | Freq: Every day | ORAL | 3 refills | Status: DC

## 2023-11-15 MED ORDER — VITAMIN D (ERGOCALCIFEROL) 1.25 MG (50000 UNIT) PO CAPS: 50000.0000 [IU] | ORAL_CAPSULE | ORAL | 3 refills | Status: AC

## 2023-11-15 NOTE — Progress Notes (Signed)
 Results to MyChart

## 2023-11-15 NOTE — Progress Notes (Signed)
 Results through MyChart

## 2023-12-19 ENCOUNTER — Telehealth: Payer: Self-pay | Admitting: Internal Medicine

## 2023-12-19 ENCOUNTER — Other Ambulatory Visit: Payer: Self-pay | Admitting: Medical

## 2023-12-19 MED ORDER — BUDESONIDE-FORMOTEROL FUMARATE 160-4.5 MCG/ACT IN AERO: 2.0000 | INHALATION_SPRAY | Freq: Two times a day (BID) | RESPIRATORY_TRACT | 2 refills | Status: AC

## 2023-12-19 NOTE — Telephone Encounter (Signed)
 Copied from CRM #[id number]. Topic: Clinical - Prescription Issue >> Dec 19, 2023 12:29 PM Selinda RAMAN wrote: Reason for CRM: The patient called in stating his preventative inhaler the beclomethasone (QVAR  REDIHALER) 80 MCG/ACT inhaler is just too expensive. He said even with insurance it cost [phone]. He is hoping his provider can call something more affordable in that his insurance will cover. He has bad seasonal allergies so he would like this done as soon as possible. He really appreciates it. Please assist patient further.

## 2023-12-20 ENCOUNTER — Other Ambulatory Visit: Payer: Self-pay | Admitting: Medical

## 2023-12-20 MED ORDER — MOUNJARO 2.5 MG/0.5ML ~~LOC~~ SOAJ: 2.5000 mg | SUBCUTANEOUS | 0 refills | Status: DC

## 2024-01-01 ENCOUNTER — Telehealth: Payer: Self-pay | Admitting: Family Medicine

## 2024-01-01 NOTE — Telephone Encounter (Signed)
 Copied from CRM #[id number]. Topic: Clinical - Prescription Issue >> Jan 01, 2024  8:59 AM Leonette SQUIBB wrote: Reason for CRM: Pt called saying he had changed his pharmacy to [facility] thinking since he worked there it would be less but it was actually more .  [facility] [place].  He needs the Valsartan , Famotidine , Rosuvastatin , Metformin .  He thinks these may have been sent to [facility] but insurance would not cover or it was too expensive,  [phone]

## 2024-01-02 MED ORDER — ROSUVASTATIN CALCIUM 10 MG PO TABS: 10.0000 mg | ORAL_TABLET | Freq: Every day | ORAL | 2 refills | Status: AC

## 2024-01-02 MED ORDER — METFORMIN HCL ER 500 MG PO TB24: 500.0000 mg | ORAL_TABLET | Freq: Every day | ORAL | 2 refills | Status: AC

## 2024-01-02 MED ORDER — FAMOTIDINE 40 MG PO TABS: 40.0000 mg | ORAL_TABLET | Freq: Every day | ORAL | 2 refills | Status: AC

## 2024-01-02 MED ORDER — VALSARTAN-HYDROCHLOROTHIAZIDE 80-12.5 MG PO TABS: 1.0000 | ORAL_TABLET | Freq: Every day | ORAL | 2 refills | Status: AC

## 2024-01-02 NOTE — Telephone Encounter (Signed)
 [facility] in [facility] [place]

## 2024-01-18 ENCOUNTER — Telehealth: Payer: Self-pay | Admitting: Medical

## 2024-01-18 ENCOUNTER — Other Ambulatory Visit: Payer: Self-pay | Admitting: Medical

## 2024-01-18 DIAGNOSIS — J4540 Moderate persistent asthma, uncomplicated: Secondary | ICD-10-CM

## 2024-01-18 MED ORDER — CETIRIZINE HCL 10 MG PO TABS: 10.0000 mg | ORAL_TABLET | Freq: Every day | ORAL | 2 refills | Status: DC

## 2024-01-18 NOTE — Telephone Encounter (Signed)
 Copied from CRM #[id number]. Topic: Clinical - Prescription Issue >> Jan 18, 2024  3:08 PM Willma R wrote: Reason for CRM: Patient was just prescribed cetirizine  (ZYRTEC ) 10 MG tablet, needs the pharmacy changed for insurance purposes. Please resend to: [id number] [place] - [address] [place] [address] Phone: [phone] Fax: [phone]  Patient can be reached at [phone] >> Jan 18, 2024  3:10 PM Rachelle R wrote: Patient also needs his albuterol  (VENTOLIN  HFA) 108 (90 Base) MCG/ACT inhaler and tirzepatide  (MOUNJARO ) 2.5 MG/0.5ML Pen [facility]

## 2024-01-19 ENCOUNTER — Other Ambulatory Visit (HOSPITAL_COMMUNITY): Payer: Self-pay

## 2024-01-19 ENCOUNTER — Telehealth: Payer: Self-pay

## 2024-01-19 MED ORDER — ALBUTEROL SULFATE HFA 108 (90 BASE) MCG/ACT IN AERS: 1.0000 | INHALATION_SPRAY | Freq: Four times a day (QID) | RESPIRATORY_TRACT | 1 refills | Status: AC | PRN

## 2024-01-19 MED ORDER — MOUNJARO 2.5 MG/0.5ML ~~LOC~~ SOAJ: 2.5000 mg | SUBCUTANEOUS | 0 refills | Status: AC

## 2024-01-19 MED ORDER — CETIRIZINE HCL 10 MG PO TABS: 10.0000 mg | ORAL_TABLET | Freq: Every day | ORAL | 2 refills | Status: AC

## 2024-01-19 NOTE — Telephone Encounter (Signed)
 Pharmacy Patient Advocate Encounter   Received notification from CoverMyMeds that prior authorization for Mounjaro  2.5MG /0.5ML auto-injectors  is required/requested.   Insurance verification completed.   The patient is insured through [employer] [facility].   Per test claim: PA required; PA submitted to above mentioned insurance via Latent Key/confirmation #/EOC [id number] Status is pending

## 2024-01-19 NOTE — Telephone Encounter (Signed)
 Refilled medications

## 2024-01-19 NOTE — Addendum Note (Signed)
 Addended by: VICCI HUSBAND A on: 01/19/2024 08:01 AM   Modules accepted: Orders

## 2024-01-22 ENCOUNTER — Other Ambulatory Visit (HOSPITAL_COMMUNITY): Payer: Self-pay

## 2024-01-22 NOTE — Telephone Encounter (Signed)
 Pharmacy Patient Advocate Encounter  Received notification from [employer] that Prior Authorization for Mounjaro  2.5MG /0.5ML auto-injectors has been APPROVED  to 12.12.26. Ran test claim, Copay is $89.82. This test claim was processed through [facility]- copay amounts may vary at other pharmacies due to pharmacy/plan contracts, or as the patient moves through the different stages of their insurance plan.   PA #/Case ID/Reference #: [id number]

## 2024-11-14 ENCOUNTER — Encounter: Payer: Self-pay | Admitting: Medical
